# Patient Record
Sex: Male | Born: 1975 | Race: White | Hispanic: No | Marital: Married | Smoking: Current some day smoker
Health system: Southern US, Community
[De-identification: ages and names within clinical notes are randomized; demographics above are authoritative.]

## PROBLEM LIST (undated history)

## (undated) DIAGNOSIS — F419 Anxiety disorder, unspecified: Secondary | ICD-10-CM

## (undated) HISTORY — PX: KNEE SURGERY: SHX244

---

## 2007-12-21 ENCOUNTER — Ambulatory Visit (HOSPITAL_BASED_OUTPATIENT_CLINIC_OR_DEPARTMENT_OTHER): Admission: RE | Admit: 2007-12-21 | Discharge: 2007-12-22 | Payer: Self-pay | Admitting: Orthopedic Surgery

## 2010-12-29 NOTE — Op Note (Signed)
NAME:  Nathaniel Rivas, Nathaniel Rivas NO.:  [id number]   MEDICAL RECORD NO.:  [id number]          PATIENT TYPE:  AMB   LOCATION:  DSC                          FACILITY:  [facility]   PHYSICIAN:  Loreta Ave, M.D. DATE OF BIRTH:  1975/12/19   DATE OF PROCEDURE:  12/21/2007  DATE OF DISCHARGE:                               OPERATIVE REPORT   PREOPERATIVE DIAGNOSES:  Left knee anterior cruciate ligament tear with  anterolateral rotary instability.  Healed medial collateral ligament  sprain.  Medial lateral meniscus tears.   POSTOPERATIVE DIAGNOSIS:  Left knee anterior cruciate ligament tear with  anterolateral rotary instability.  Healed medial collateral ligament  sprain.  Medial lateral meniscus tears with a reparable medial meniscus  tear, irreparable lateral meniscus tear, and a healed medial collateral  ligament sprain.   PROCEDURE:  Left knee exam under anesthesia, arthroscopy with partial  lateral meniscectomy.  Medial meniscus repair with a suture bridge  Arthrex tied.  Arthroscopic endoscopic anterior cruciate ligament  reconstruction, patellar tendon autograft bone tendon bone with  notchplasty, bioabsorbable screw fixation.   SURGEON:  Loreta Ave, MD   ASSISTANT:  Zonia Kief, PA present throughout the entire case necessary  for timely completion of procedure.   ANESTHESIA:  General.   BLOOD LOSS:  Minimal.   TOURNIQUET TIME:  1 hour 20 minutes.   SPECIMENS:  None.   CULTURES:  None.   COMPLICATIONS:  None.   PROCEDURE:  Soft compressor with knee immobilizer.   PROCEDURE:  The patient brought to the operating room, placed on the  operating table in supine position.  After adequate anesthesia had been  obtained, both knees examined.  Of note, the offset right uninjured knee  was examined and it was found to have posterior cruciate ligament  instability with a positive ski slope and positive posterior drawer.  ACL collaterals all intact, full motion.   The newly anterior left knee  examined.  Positive Lachman, positive drawer, positive pivot shift.  MCL  stable in full extension and flexion.  Full motion.  Tourniquet applied  on the left.  Prepped and draped in usual sterile fashion.  Exsanguinated with elevation, Esmarch tourniquet inflated to 350 mmHg.  Three portals created, one superolateral, one each medial, and lateral  parapatellar.  Inflow catheter induced, the standard arthroscope was  induced and inspected.  Articular cartilage intact except for some grade  1-2 fissuring medial femoral condyle for a missed impaction injury.  Superficial debridement.  No areas of full-thickness loss.  ACL  complete, mid substance tear debrided.  PCL intact.  Moderate narrowing  of the notch open with notchplasty with a shaver and bur.  Medial  meniscus undersurface peripheral tear junction middle posterior third  going about three-quarters way through on the bottom.  Scar five.  Very  peripheral.  It was repaired with the Arthrex suture bridge placing a  horizontal mattress suture across the tear anchored it on the backside  with the peak anchors.  Nice firm repair confirmed.  Lateral meniscus  had complex irreparable tearing entire posterior half.  About half the  posterior half removed,  a large flap taken off the back, and I was able  to leave about half the posterior half intact tapering the remaining  meniscus.  Instruments and fluid were then removed.  Anterior incision  patella to tibial tubercle.  Middle third patellar tendon harvested,  bone tendon, bone for 10-mm tunnels and prepared for 10-mm tunnels  placing FiberWire at either end.  Defect in the tendon closed with 0  Vicryl.  Arthroscope reintroduced.  Notchplasty confirmed.  Small  incision medial to the tibial tubercle.  K-wire driven from there out  through the footprint of the ACL on the tibia.  Good placement  confirmed.  Overdrilled with a 10-mm reamer.  Debris cleared with  a  shaver.  Femoral guide inserted across tibial tunnel notch on back  cortex femur.  K-wire driven and then overdrilled with a 2-mm reamer for  appropriate depth of the graft and pegs.  Both tunnels assessed found to  be in good position.  Debris cleared throughout the knee.  Tubing passer  inserted across both tunnels and out through a stab wound in the  anterolateral thigh.  Nitinol wire brought to the medial portal out  through the femoral tunnel.  Graft attached, tubing passer pulled in  across the knee seating the pegs well in the tibia and femoral tunnels.  Fixed in the femoral tunnel with a 9 x 25 bioabsorbable screw placed  over nitinol wire.  Excellent capturing and fixation confirmed.  Knee  placed at 7 degrees of flexion, posterior drawer applied, grafted  tightly, and then fixed in the tibial tunnel over nitinol wire with the  10 x 25 bioabsorbable screw.  At completion, excellent capture and  fixation of the graft above and below.  Negative Lachman and drawer.  Full motion.  No impingement on the graft when viewed through full  motion arthroscopically.  Entire knee exam arthroscopically no other  findings appreciated.  Instruments and fluid all removed.  Wounds were  all irrigated and closed with subcutaneous subcuticular Vicryl.  Portals  all closed with nylon.  Sterile compressive dressing applied.  Tourniquet fully removed.  Knee immobilizer applied.  Anesthesia  reversed.  Brought to recovery room.  Tolerated surgery well.  No  complications.      Loreta Ave, M.D.  Electronically Signed     DFM/MEDQ  D:  12/21/2007  T:  12/22/2007  Job:  [id number]

## 2010-12-29 NOTE — Op Note (Signed)
NAME:  Nathaniel Rivas, Nathaniel Rivas           ACCOUNT NO.:  [id number]   MEDICAL RECORD NO.:  [id number]          PATIENT TYPE:  AMB   LOCATION:  DSC                          FACILITY:  [facility]   PHYSICIAN:  Daniel F. Murphy, M.D. DATE OF BIRTH:  08/16/1975   DATE OF PROCEDURE:  12/21/2007  DATE OF DISCHARGE:                               OPERATIVE REPORT   PREOPERATIVE DIAGNOSES:  Left knee anterior cruciate ligament tear with  anterolateral rotary instability.  Healed medial collateral ligament  sprain.  Medial lateral meniscus tears.   POSTOPERATIVE DIAGNOSIS:  Left knee anterior cruciate ligament tear with  anterolateral rotary instability.  Healed medial collateral ligament  sprain.  Medial lateral meniscus tears with a reparable medial meniscus  tear, irreparable lateral meniscus tear, and a healed medial collateral  ligament sprain.   PROCEDURE:  Left knee exam under anesthesia, arthroscopy with partial  lateral meniscectomy.  Medial meniscus repair with a suture bridge  Arthrex tied.  Arthroscopic endoscopic anterior cruciate ligament  reconstruction, patellar tendon autograft bone tendon bone with  notchplasty, bioabsorbable screw fixation.   SURGEON:  Daniel F. Murphy, MD   ASSISTANT:  James Owens, PA present throughout the entire case necessary  for timely completion of procedure.   ANESTHESIA:  General.   BLOOD LOSS:  Minimal.   TOURNIQUET TIME:  1 hour 20 minutes.   SPECIMENS:  None.   CULTURES:  None.   COMPLICATIONS:  None.   PROCEDURE:  Soft compressor with knee immobilizer.   PROCEDURE:  The patient brought to the operating room, placed on the  operating table in supine position.  After adequate anesthesia had been  obtained, both knees examined.  Of note, the offset right uninjured knee  was examined and it was found to have posterior cruciate ligament  instability with a positive ski slope and positive posterior drawer.  ACL collaterals all intact, full motion.   The newly anterior left knee  examined.  Positive Lachman, positive drawer, positive pivot shift.  MCL  stable in full extension and flexion.  Full motion.  Tourniquet applied  on the left.  Prepped and draped in usual sterile fashion.  Exsanguinated with elevation, Esmarch tourniquet inflated to 350 mmHg.  Three portals created, one superolateral, one each medial, and lateral  parapatellar.  Inflow catheter induced, the standard arthroscope was  induced and inspected.  Articular cartilage intact except for some grade  1-2 fissuring medial femoral condyle for a missed impaction injury.  Superficial debridement.  No areas of full-thickness loss.  ACL  complete, mid substance tear debrided.  PCL intact.  Moderate narrowing  of the notch open with notchplasty with a shaver and bur.  Medial  meniscus undersurface peripheral tear junction middle posterior third  going about three-quarters way through on the bottom.  Scar five.  Very  peripheral.  It was repaired with the Arthrex suture bridge placing a  horizontal mattress suture across the tear anchored it on the backside  with the peak anchors.  Nice firm repair confirmed.  Lateral meniscus  had complex irreparable tearing entire posterior half.  About half the  posterior half removed,   a large flap taken off the back, and I was able  to leave about half the posterior half intact tapering the remaining  meniscus.  Instruments and fluid were then removed.  Anterior incision  patella to tibial tubercle.  Middle third patellar tendon harvested,  bone tendon, bone for 10-mm tunnels and prepared for 10-mm tunnels  placing FiberWire at either end.  Defect in the tendon closed with 0  Vicryl.  Arthroscope reintroduced.  Notchplasty confirmed.  Small  incision medial to the tibial tubercle.  K-wire driven from there out  through the footprint of the ACL on the tibia.  Good placement  confirmed.  Overdrilled with a 10-mm reamer.  Debris cleared with  a  shaver.  Femoral guide inserted across tibial tunnel notch on back  cortex femur.  K-wire driven and then overdrilled with a 2-mm reamer for  appropriate depth of the graft and pegs.  Both tunnels assessed found to  be in good position.  Debris cleared throughout the knee.  Tubing passer  inserted across both tunnels and out through a stab wound in the  anterolateral thigh.  Nitinol wire brought to the medial portal out  through the femoral tunnel.  Graft attached, tubing passer pulled in  across the knee seating the pegs well in the tibia and femoral tunnels.  Fixed in the femoral tunnel with a 9 x 25 bioabsorbable screw placed  over nitinol wire.  Excellent capturing and fixation confirmed.  Knee  placed at 7 degrees of flexion, posterior drawer applied, grafted  tightly, and then fixed in the tibial tunnel over nitinol wire with the  10 x 25 bioabsorbable screw.  At completion, excellent capture and  fixation of the graft above and below.  Negative Lachman and drawer.  Full motion.  No impingement on the graft when viewed through full  motion arthroscopically.  Entire knee exam arthroscopically no other  findings appreciated.  Instruments and fluid all removed.  Wounds were  all irrigated and closed with subcutaneous subcuticular Vicryl.  Portals  all closed with nylon.  Sterile compressive dressing applied.  Tourniquet fully removed.  Knee immobilizer applied.  Anesthesia  reversed.  Brought to recovery room.  Tolerated surgery well.  No  complications.      Daniel F. Murphy, M.D.  Electronically Signed     DFM/MEDQ  D:  12/21/2007  T:  12/22/2007  Job:  [id number] 

## 2011-02-16 ENCOUNTER — Emergency Department (HOSPITAL_BASED_OUTPATIENT_CLINIC_OR_DEPARTMENT_OTHER)
Admission: EM | Admit: 2011-02-16 | Discharge: 2011-02-16 | Disposition: A | Discharge status: Home / Self Care | Payer: BC Managed Care – PPO | Attending: Emergency Medicine | Admitting: Emergency Medicine

## 2011-02-16 DIAGNOSIS — R040 Epistaxis: Secondary | ICD-10-CM | POA: Insufficient documentation

## 2014-04-25 DIAGNOSIS — F419 Anxiety disorder, unspecified: Secondary | ICD-10-CM | POA: Insufficient documentation

## 2014-09-27 DIAGNOSIS — E7800 Pure hypercholesterolemia, unspecified: Secondary | ICD-10-CM | POA: Insufficient documentation

## 2015-10-06 DIAGNOSIS — Z720 Tobacco use: Secondary | ICD-10-CM | POA: Insufficient documentation

## 2016-04-20 ENCOUNTER — Ambulatory Visit (INDEPENDENT_AMBULATORY_CARE_PROVIDER_SITE_OTHER): Payer: BLUE CROSS/BLUE SHIELD | Admitting: Family Medicine

## 2016-04-20 ENCOUNTER — Ambulatory Visit (HOSPITAL_BASED_OUTPATIENT_CLINIC_OR_DEPARTMENT_OTHER)
Admission: RE | Admit: 2016-04-20 | Discharge: 2016-04-20 | Disposition: A | Discharge status: Home / Self Care | Payer: BLUE CROSS/BLUE SHIELD | Source: Ambulatory Visit | Attending: Family Medicine | Admitting: Family Medicine

## 2016-04-20 ENCOUNTER — Encounter: Payer: Self-pay | Admitting: Family Medicine

## 2016-04-20 VITALS — BP 113/81 | HR 76 | Ht 69.0 in | Wt 190.0 lb

## 2016-04-20 DIAGNOSIS — R937 Abnormal findings on diagnostic imaging of other parts of musculoskeletal system: Secondary | ICD-10-CM | POA: Diagnosis not present

## 2016-04-20 DIAGNOSIS — M25561 Pain in right knee: Secondary | ICD-10-CM | POA: Insufficient documentation

## 2016-04-20 MED ORDER — MELOXICAM 15 MG PO TABS: 15.0000 mg | ORAL_TABLET | Freq: Every day | ORAL | 2 refills | Status: DC

## 2016-04-20 NOTE — Patient Instructions (Signed)
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 Ice the area 15 minutes at a time 3-4 times a day. Meloxicam 15mg  daily with food for pain, inflammation, swelling - take at least for 7-10 days regularly then as needed. Compression sleeve or ACE wrap during the day to help keep swelling down. Knee curls, hacky sack, inside leg raise exercises 3 sets of 10 once a day. We can consider a cortisone injection for the bursitis if you're not improving. You can consider wearing a kneepad too to prevent irritation of this. Typically these take 4-6 weeks total to resolve though with your line of work it may take longer.

## 2016-04-22 DIAGNOSIS — M25561 Pain in right knee: Secondary | ICD-10-CM | POA: Insufficient documentation

## 2016-04-22 NOTE — Assessment & Plan Note (Signed)
Independently reviewed radiographs and no evidence fracture.  Exam consistent with traumatic pes bursitis and ultrasound confirms this.  He will start with icing, meloxicam, compression.  Shown home exercises to do daily.  Consider injection if not improving.  Reports he already is using a kneepad.  F/u in 4-6 weeks.

## 2016-04-22 NOTE — Progress Notes (Signed)
PCP: Angelica ChessmanAGUIAR,RAFAELA M., MD  Subjective:   HPI: Patient is a 40 y.o. male here for right knee pain.  Patient reports 3 weeks ago he accidentally struck medial right knee on a metal bedframe. Has continued to have pain in this area anteromedial right knee since then. Associated swelling. Pain is 3/10, dull. Has not tried anything for this. No prior issues with this knee. No skin changes, numbness.  No past medical history on file.  No current outpatient prescriptions on file prior to visit.   No current facility-administered medications on file prior to visit.     No past surgical history on file.  No Known Allergies  Social History   Social History  . Marital status: Married    Spouse name: N/A  . Number of children: N/A  . Years of education: N/A   Occupational History  . Not on file.   Social History Main Topics  . Smoking status: Current Some Day Smoker  . Smokeless tobacco: Never Used  . Alcohol use Not on file  . Drug use: Unknown  . Sexual activity: Not on file   Other Topics Concern  . Not on file   Social History Narrative  . No narrative on file    No family history on file.  BP 113/81   Pulse 76   Ht 5\' 9"  (1.753 m)   Wt 190 lb (86.2 kg)   BMI 28.06 kg/m   Review of Systems: See HPI above.    Objective:  Physical Exam:  Gen: NAD, comfortable in exam room  Right knee: Prominent tibial tubercle, mild swelling pes area.  No effusion, bruising, other deformity. TTP pes bursa only.  No joint line, other tenderness. FROM. Negative ant/post drawers. Negative valgus/varus testing. Negative lachmanns. Negative mcmurrays, apleys, patellar apprehension. NV intact distally.  Left knee: FROM without pain.  MSK u/s right knee: pes bursitis noted.  Soft tissue swelling over tibial tubercle but no bony abnormalities.    Assessment & Plan:  1. Right knee pain - Independently reviewed radiographs and no evidence fracture.  Exam consistent with  traumatic pes bursitis and ultrasound confirms this.  He will start with icing, meloxicam, compression.  Shown home exercises to do daily.  Consider injection if not improving.  Reports he already is using a kneepad.  F/u in 4-6 weeks.

## 2017-03-28 ENCOUNTER — Emergency Department (HOSPITAL_BASED_OUTPATIENT_CLINIC_OR_DEPARTMENT_OTHER): Payer: Self-pay

## 2017-03-28 ENCOUNTER — Encounter (HOSPITAL_COMMUNITY)
Admission: EM | Disposition: A | Discharge status: Home / Self Care | Payer: Self-pay | Source: Home / Self Care | Attending: General Surgery

## 2017-03-28 ENCOUNTER — Inpatient Hospital Stay (HOSPITAL_BASED_OUTPATIENT_CLINIC_OR_DEPARTMENT_OTHER)
Admission: EM | Admit: 2017-03-28 | Discharge: 2017-03-29 | DRG: 340 | Disposition: A | Discharge status: Home / Self Care | Payer: Self-pay | Attending: General Surgery | Admitting: General Surgery

## 2017-03-28 ENCOUNTER — Inpatient Hospital Stay (HOSPITAL_COMMUNITY): Payer: Self-pay | Admitting: Anesthesiology

## 2017-03-28 ENCOUNTER — Encounter (HOSPITAL_BASED_OUTPATIENT_CLINIC_OR_DEPARTMENT_OTHER): Payer: Self-pay | Admitting: Emergency Medicine

## 2017-03-28 DIAGNOSIS — K353 Acute appendicitis with localized peritonitis: Principal | ICD-10-CM | POA: Diagnosis present

## 2017-03-28 DIAGNOSIS — F419 Anxiety disorder, unspecified: Secondary | ICD-10-CM | POA: Diagnosis present

## 2017-03-28 DIAGNOSIS — F17200 Nicotine dependence, unspecified, uncomplicated: Secondary | ICD-10-CM | POA: Diagnosis present

## 2017-03-28 DIAGNOSIS — K3589 Other acute appendicitis without perforation or gangrene: Secondary | ICD-10-CM

## 2017-03-28 DIAGNOSIS — K37 Unspecified appendicitis: Secondary | ICD-10-CM | POA: Diagnosis present

## 2017-03-28 DIAGNOSIS — K3533 Acute appendicitis with perforation and localized peritonitis, with abscess: Secondary | ICD-10-CM | POA: Diagnosis present

## 2017-03-28 HISTORY — DX: Anxiety disorder, unspecified: F41.9

## 2017-03-28 HISTORY — PX: LAPAROSCOPIC APPENDECTOMY: SHX408

## 2017-03-28 LAB — CBC WITH DIFFERENTIAL/PLATELET
Basophils Absolute: 0 10*3/uL (ref 0.0–0.1)
Basophils Relative: 0 %
EOS ABS: 0.2 10*3/uL (ref 0.0–0.7)
Eosinophils Relative: 2 %
HCT: 38.6 % — ABNORMAL LOW (ref 39.0–52.0)
HEMOGLOBIN: 13.4 g/dL (ref 13.0–17.0)
LYMPHS PCT: 13 %
Lymphs Abs: 1.2 10*3/uL (ref 0.7–4.0)
MCH: 32.1 pg (ref 26.0–34.0)
MCHC: 34.7 g/dL (ref 30.0–36.0)
MCV: 92.6 fL (ref 78.0–100.0)
Monocytes Absolute: 0.8 10*3/uL (ref 0.1–1.0)
Monocytes Relative: 9 %
NEUTROS ABS: 6.7 10*3/uL (ref 1.7–7.7)
NEUTROS PCT: 75 %
Platelets: 247 10*3/uL (ref 150–400)
RBC: 4.17 MIL/uL — AB (ref 4.22–5.81)
RDW: 12.5 % (ref 11.5–15.5)
WBC: 8.9 10*3/uL (ref 4.0–10.5)

## 2017-03-28 LAB — COMPREHENSIVE METABOLIC PANEL
ALBUMIN: 3.9 g/dL (ref 3.5–5.0)
ALT: 22 U/L (ref 17–63)
ANION GAP: 8 (ref 5–15)
AST: 21 U/L (ref 15–41)
Alkaline Phosphatase: 61 U/L (ref 38–126)
BILIRUBIN TOTAL: 0.9 mg/dL (ref 0.3–1.2)
BUN: 12 mg/dL (ref 6–20)
CALCIUM: 9 mg/dL (ref 8.9–10.3)
CO2: 27 mmol/L (ref 22–32)
Chloride: 102 mmol/L (ref 101–111)
Creatinine, Ser: 0.98 mg/dL (ref 0.61–1.24)
Glucose, Bld: 100 mg/dL — ABNORMAL HIGH (ref 65–99)
POTASSIUM: 4 mmol/L (ref 3.5–5.1)
Sodium: 137 mmol/L (ref 135–145)
TOTAL PROTEIN: 7.2 g/dL (ref 6.5–8.1)

## 2017-03-28 LAB — URINALYSIS, ROUTINE W REFLEX MICROSCOPIC
GLUCOSE, UA: NEGATIVE mg/dL
Hgb urine dipstick: NEGATIVE
Ketones, ur: 15 mg/dL — AB
LEUKOCYTES UA: NEGATIVE
Nitrite: NEGATIVE
PROTEIN: NEGATIVE mg/dL
Specific Gravity, Urine: 1.037 — ABNORMAL HIGH (ref 1.005–1.030)
pH: 6.5 (ref 5.0–8.0)

## 2017-03-28 LAB — MRSA PCR SCREENING: MRSA BY PCR: NEGATIVE

## 2017-03-28 LAB — LIPASE, BLOOD: LIPASE: 24 U/L (ref 11–51)

## 2017-03-28 SURGERY — APPENDECTOMY, LAPAROSCOPIC
Anesthesia: General

## 2017-03-28 MED ORDER — HYDROCODONE-ACETAMINOPHEN 5-325 MG PO TABS
1.0000 | ORAL_TABLET | ORAL | Status: DC | PRN
  Administered 2017-03-29: 2 via ORAL
  Filled 2017-03-28: qty 2

## 2017-03-28 MED ORDER — BUPIVACAINE-EPINEPHRINE (PF) 0.25% -1:200000 IJ SOLN
INTRAMUSCULAR | Status: AC
  Filled 2017-03-28: qty 30

## 2017-03-28 MED ORDER — SUGAMMADEX SODIUM 200 MG/2ML IV SOLN
INTRAVENOUS | Status: DC | PRN
  Administered 2017-03-28: 200 mg via INTRAVENOUS

## 2017-03-28 MED ORDER — ONDANSETRON HCL 4 MG/2ML IJ SOLN: 4.0000 mg | Freq: Once | INTRAMUSCULAR | Status: DC | PRN

## 2017-03-28 MED ORDER — METRONIDAZOLE IN NACL 5-0.79 MG/ML-% IV SOLN
500.0000 mg | Freq: Three times a day (TID) | INTRAVENOUS | Status: DC
  Administered 2017-03-28 – 2017-03-29 (×2): 500 mg via INTRAVENOUS
  Filled 2017-03-28 (×4): qty 100

## 2017-03-28 MED ORDER — SUCCINYLCHOLINE CHLORIDE 200 MG/10ML IV SOSY
PREFILLED_SYRINGE | INTRAVENOUS | Status: AC
  Filled 2017-03-28: qty 10

## 2017-03-28 MED ORDER — DEXTROSE 5 % IV SOLN
2.0000 g | INTRAVENOUS | Status: DC
  Filled 2017-03-28: qty 2

## 2017-03-28 MED ORDER — BUPIVACAINE-EPINEPHRINE 0.25% -1:200000 IJ SOLN
INTRAMUSCULAR | Status: DC | PRN
  Administered 2017-03-28: 30 mL

## 2017-03-28 MED ORDER — DEXAMETHASONE SODIUM PHOSPHATE 10 MG/ML IJ SOLN
INTRAMUSCULAR | Status: DC | PRN
  Administered 2017-03-28: 10 mg via INTRAVENOUS

## 2017-03-28 MED ORDER — IOPAMIDOL (ISOVUE-300) INJECTION 61%
100.0000 mL | Freq: Once | INTRAVENOUS | Status: AC | PRN
  Administered 2017-03-28: 100 mL via INTRAVENOUS

## 2017-03-28 MED ORDER — KETOROLAC TROMETHAMINE 30 MG/ML IJ SOLN
30.0000 mg | Freq: Once | INTRAMUSCULAR | Status: AC
  Administered 2017-03-28: 30 mg via INTRAVENOUS
  Filled 2017-03-28: qty 1

## 2017-03-28 MED ORDER — PROPOFOL 10 MG/ML IV BOLUS
INTRAVENOUS | Status: AC
  Filled 2017-03-28: qty 20

## 2017-03-28 MED ORDER — ONDANSETRON HCL 4 MG/2ML IJ SOLN: 4.0000 mg | Freq: Four times a day (QID) | INTRAMUSCULAR | Status: DC | PRN

## 2017-03-28 MED ORDER — SODIUM CHLORIDE 0.9 % IV SOLN: INTRAVENOUS | Status: DC

## 2017-03-28 MED ORDER — LACTATED RINGERS IV SOLN: INTRAVENOUS | Status: DC

## 2017-03-28 MED ORDER — ROCURONIUM BROMIDE 50 MG/5ML IV SOSY
PREFILLED_SYRINGE | INTRAVENOUS | Status: AC
  Filled 2017-03-28: qty 5

## 2017-03-28 MED ORDER — HYDROMORPHONE HCL-NACL 0.5-0.9 MG/ML-% IV SOSY: 0.2500 mg | PREFILLED_SYRINGE | INTRAVENOUS | Status: DC | PRN

## 2017-03-28 MED ORDER — SUGAMMADEX SODIUM 200 MG/2ML IV SOLN
INTRAVENOUS | Status: AC
  Filled 2017-03-28: qty 2

## 2017-03-28 MED ORDER — MIDAZOLAM HCL 2 MG/2ML IJ SOLN
INTRAMUSCULAR | Status: AC
  Filled 2017-03-28: qty 2

## 2017-03-28 MED ORDER — 0.9 % SODIUM CHLORIDE (POUR BTL) OPTIME
TOPICAL | Status: DC | PRN
  Administered 2017-03-28: 1000 mL

## 2017-03-28 MED ORDER — METRONIDAZOLE IN NACL 5-0.79 MG/ML-% IV SOLN
500.0000 mg | Freq: Once | INTRAVENOUS | Status: AC
  Administered 2017-03-28: 500 mg via INTRAVENOUS
  Filled 2017-03-28: qty 100

## 2017-03-28 MED ORDER — ENOXAPARIN SODIUM 40 MG/0.4ML ~~LOC~~ SOLN
40.0000 mg | SUBCUTANEOUS | Status: DC
  Administered 2017-03-29: 40 mg via SUBCUTANEOUS
  Filled 2017-03-28: qty 0.4

## 2017-03-28 MED ORDER — ROCURONIUM BROMIDE 10 MG/ML (PF) SYRINGE
PREFILLED_SYRINGE | INTRAVENOUS | Status: DC | PRN
  Administered 2017-03-28: 40 mg via INTRAVENOUS

## 2017-03-28 MED ORDER — MEPERIDINE HCL 50 MG/ML IJ SOLN: 6.2500 mg | INTRAMUSCULAR | Status: DC | PRN

## 2017-03-28 MED ORDER — SUCCINYLCHOLINE CHLORIDE 200 MG/10ML IV SOSY
PREFILLED_SYRINGE | INTRAVENOUS | Status: DC | PRN
  Administered 2017-03-28: 180 mg via INTRAVENOUS

## 2017-03-28 MED ORDER — FENTANYL CITRATE (PF) 100 MCG/2ML IJ SOLN
INTRAMUSCULAR | Status: DC | PRN
  Administered 2017-03-28 (×2): 50 ug via INTRAVENOUS
  Administered 2017-03-28: 150 ug via INTRAVENOUS

## 2017-03-28 MED ORDER — LACTATED RINGERS IV SOLN
INTRAVENOUS | Status: DC
  Administered 2017-03-28: 19:00:00 via INTRAVENOUS

## 2017-03-28 MED ORDER — ONDANSETRON 4 MG PO TBDP: 4.0000 mg | ORAL_TABLET | Freq: Four times a day (QID) | ORAL | Status: DC | PRN

## 2017-03-28 MED ORDER — MORPHINE SULFATE (PF) 2 MG/ML IV SOLN
2.0000 mg | INTRAVENOUS | Status: DC | PRN
  Administered 2017-03-28 – 2017-03-29 (×3): 2 mg via INTRAVENOUS
  Filled 2017-03-28 (×3): qty 1

## 2017-03-28 MED ORDER — ONDANSETRON HCL 4 MG/2ML IJ SOLN
INTRAMUSCULAR | Status: DC | PRN
  Administered 2017-03-28: 4 mg via INTRAVENOUS

## 2017-03-28 MED ORDER — LACTATED RINGERS IR SOLN
Status: DC | PRN
  Administered 2017-03-28: 1000 mL

## 2017-03-28 MED ORDER — LIDOCAINE 2% (20 MG/ML) 5 ML SYRINGE
INTRAMUSCULAR | Status: DC | PRN
  Administered 2017-03-28: 100 mg via INTRAVENOUS

## 2017-03-28 MED ORDER — ALPRAZOLAM 0.25 MG PO TABS
0.2500 mg | ORAL_TABLET | Freq: Two times a day (BID) | ORAL | Status: DC | PRN
  Administered 2017-03-28: 0.25 mg via ORAL
  Filled 2017-03-28: qty 1

## 2017-03-28 MED ORDER — LIDOCAINE 2% (20 MG/ML) 5 ML SYRINGE
INTRAMUSCULAR | Status: AC
  Filled 2017-03-28: qty 5

## 2017-03-28 MED ORDER — SODIUM CHLORIDE 0.9 % IV SOLN
INTRAVENOUS | Status: DC | PRN
  Administered 2017-03-28 (×2): via INTRAVENOUS

## 2017-03-28 MED ORDER — PROPOFOL 10 MG/ML IV BOLUS
INTRAVENOUS | Status: DC | PRN
  Administered 2017-03-28: 150 mg via INTRAVENOUS

## 2017-03-28 MED ORDER — FENTANYL CITRATE (PF) 250 MCG/5ML IJ SOLN
INTRAMUSCULAR | Status: AC
  Filled 2017-03-28: qty 5

## 2017-03-28 MED ORDER — DEXTROSE 5 % IV SOLN
1.0000 g | Freq: Once | INTRAVENOUS | Status: AC
  Administered 2017-03-28: 1 g via INTRAVENOUS
  Filled 2017-03-28: qty 10

## 2017-03-28 MED ORDER — SODIUM CHLORIDE 0.9 % IV BOLUS (SEPSIS)
1000.0000 mL | Freq: Once | INTRAVENOUS | Status: AC
  Administered 2017-03-28: 1000 mL via INTRAVENOUS

## 2017-03-28 SURGICAL SUPPLY — 33 items
APPLIER CLIP 5 13 M/L LIGAMAX5 (MISCELLANEOUS)
APPLIER CLIP ROT 10 11.4 M/L (STAPLE)
CABLE HIGH FREQUENCY MONO STRZ (ELECTRODE) IMPLANT
CHLORAPREP W/TINT 26ML (MISCELLANEOUS) ×3 IMPLANT
CLIP APPLIE 5 13 M/L LIGAMAX5 (MISCELLANEOUS) IMPLANT
CLIP APPLIE ROT 10 11.4 M/L (STAPLE) IMPLANT
COVER SURGICAL LIGHT HANDLE (MISCELLANEOUS) ×3 IMPLANT
CUTTER FLEX LINEAR 45M (STAPLE) ×3 IMPLANT
DECANTER SPIKE VIAL GLASS SM (MISCELLANEOUS) ×3 IMPLANT
DERMABOND ADVANCED (GAUZE/BANDAGES/DRESSINGS) ×2
DERMABOND ADVANCED .7 DNX12 (GAUZE/BANDAGES/DRESSINGS) ×1 IMPLANT
DRAPE LAPAROSCOPIC ABDOMINAL (DRAPES) ×3 IMPLANT
ELECT REM PT RETURN 15FT ADLT (MISCELLANEOUS) ×3 IMPLANT
GLOVE BIOGEL PI IND STRL 7.5 (GLOVE) ×1 IMPLANT
GLOVE BIOGEL PI INDICATOR 7.5 (GLOVE) ×2
GLOVE ECLIPSE 7.5 STRL STRAW (GLOVE) ×3 IMPLANT
GOWN STRL REUS W/TWL XL LVL3 (GOWN DISPOSABLE) ×6 IMPLANT
KIT BASIN OR (CUSTOM PROCEDURE TRAY) ×3 IMPLANT
PAD POSITIONING PINK XL (MISCELLANEOUS) ×3 IMPLANT
POUCH SPECIMEN RETRIEVAL 10MM (ENDOMECHANICALS) ×3 IMPLANT
RELOAD 45 VASCULAR/THIN (ENDOMECHANICALS) IMPLANT
RELOAD STAPLE TA45 3.5 REG BLU (ENDOMECHANICALS) ×3 IMPLANT
SCISSORS LAP 5X35 DISP (ENDOMECHANICALS) ×3 IMPLANT
SHEARS HARMONIC ACE PLUS 36CM (ENDOMECHANICALS) ×3 IMPLANT
SLEEVE XCEL OPT CAN 5 100 (ENDOMECHANICALS) ×3 IMPLANT
SUT MNCRL AB 4-0 PS2 18 (SUTURE) ×3 IMPLANT
SUT VICRYL 0 UR6 27IN ABS (SUTURE) ×3 IMPLANT
TOWEL OR 17X26 10 PK STRL BLUE (TOWEL DISPOSABLE) ×3 IMPLANT
TRAY FOLEY W/METER SILVER 16FR (SET/KITS/TRAYS/PACK) ×3 IMPLANT
TRAY LAPAROSCOPIC (CUSTOM PROCEDURE TRAY) ×3 IMPLANT
TROCAR BLADELESS OPT 5 100 (ENDOMECHANICALS) ×3 IMPLANT
TROCAR XCEL BLUNT TIP 100MML (ENDOMECHANICALS) ×3 IMPLANT
TUBING INSUF HEATED (TUBING) ×3 IMPLANT

## 2017-03-28 NOTE — ED Notes (Signed)
ED Provider at bedside. 

## 2017-03-28 NOTE — ED Notes (Signed)
RN present as witness for GI/GU exam by PA at bedside.

## 2017-03-28 NOTE — ED Notes (Signed)
Patient transported to CT 

## 2017-03-28 NOTE — ED Provider Notes (Signed)
[facility] Note   CSN: [id number] Arrival date & time: 03/28/17  0849     History   Chief Complaint Chief Complaint  Patient presents with  . Abdominal Pain    HPI Nathaniel Rivas is a 41 y.o. male who presents with 2 days of right sided abdominal pain. Patient reports that pain has been constant and describes it as a "stabbing" pain. He currently rates his abdominal pain at a 8/10. He reports that he is taken [facility] Goody's with no improvement of symptoms. He denies any other alleviating or aggravating factors. Patient reports some decreased appetite but states that he has been able to tolerate PO. Patient reports that his last bowel movement was yesterday and was normal. No blood or melena. Patient denies any fever, nausea/vomiting, chest pain, difficult breathing, dysuria, hematuria, testicular pain/swelling penile pain..  The history is provided by the patient.    Past Medical History:  Diagnosis Date  . Anxiety     Patient Active Problem List   Diagnosis Date Noted  . Appendicitis 03/28/2017  . Right knee pain 04/22/2016  . Tobacco use 10/06/2015  . Elevated cholesterol 09/27/2014  . Anxiety 04/25/2014    Past Surgical History:  Procedure Laterality Date  . KNEE SURGERY         Home Medications    Prior to Admission medications   Medication Sig Start Date End Date Taking? Authorizing Provider  ALPRAZolam Prudy Feeler) 0.25 MG tablet  04/06/16   [provider]    Family History No family history on file.  Social History Social History  Substance Use Topics  . Smoking status: Current Some Day Smoker  . Smokeless tobacco: Never Used  . Alcohol use Yes     Allergies   Patient has no known allergies.   Review of Systems Review of Systems  Constitutional: Positive for appetite change. Negative for chills and fever.  Respiratory: Negative for shortness of breath.   Cardiovascular: Negative for chest pain.  Gastrointestinal:  Positive for abdominal pain. Negative for diarrhea, nausea and vomiting.  Genitourinary: Negative for dysuria and hematuria.     Physical Exam Updated Vital Signs BP 121/85 (BP Location: Left Arm)   Pulse 65   Temp 97.9 F (36.6 C) (Oral)   Resp 18   Ht 5\' 9"  (1.753 m)   Wt 88.5 kg (195 lb)   SpO2 98%   BMI 28.80 kg/m   Physical Exam  Constitutional: He is oriented to person, place, and time. He appears well-developed and well-nourished.  Sitting comfortably on examination table  HENT:  Head: Normocephalic and atraumatic.  Mouth/Throat: Oropharynx is clear and moist and mucous membranes are normal.  Eyes: Pupils are equal, round, and reactive to light. Conjunctivae, EOM and lids are normal.  Neck: Full passive range of motion without pain.  Cardiovascular: Normal rate, regular rhythm, normal heart sounds and normal pulses.  Exam reveals no gallop and no friction rub.   No murmur heard. Pulmonary/Chest: Effort normal and breath sounds normal.  Abdominal: Soft. Normal appearance and bowel sounds are normal. There is tenderness. There is tenderness at McBurney's point. There is no rigidity, no guarding, no CVA tenderness and negative Murphy's sign. Hernia confirmed negative in the right inguinal area and confirmed negative in the left inguinal area.    Abdomen is soft, non-distended. Tenderness to palpation to the mid/lower right abdomen. Minimal tenderness at McBurney's point.  Genitourinary: Testes normal and penis normal. Right testis shows no swelling and no tenderness.  Left testis shows no swelling and no tenderness. Uncircumcised.  Genitourinary Comments: The exam was performed with a chaperone present. Normal external male genitalia.   Musculoskeletal: Normal range of motion.  Neurological: He is alert and oriented to person, place, and time.  Skin: Skin is warm and dry. Capillary refill takes less than 2 seconds.  Psychiatric: He has a normal mood and affect. His speech is  normal.  Nursing note and vitals reviewed.    ED Treatments / Results  Labs (all labs ordered are listed, but only abnormal results are displayed) Labs Reviewed  COMPREHENSIVE METABOLIC PANEL - Abnormal; Notable for the following:       Result Value   Glucose, Bld 100 (*)    All other components within normal limits  CBC WITH DIFFERENTIAL/PLATELET - Abnormal; Notable for the following:    RBC 4.17 (*)    HCT 38.6 (*)    All other components within normal limits  URINALYSIS, ROUTINE W REFLEX MICROSCOPIC - Abnormal; Notable for the following:    Color, Urine AMBER (*)    Specific Gravity, Urine 1.037 (*)    Bilirubin Urine SMALL (*)    Ketones, ur 15 (*)    All other components within normal limits  LIPASE, BLOOD  HIV ANTIBODY (ROUTINE TESTING)    EKG  EKG Interpretation None       Radiology Ct Abdomen Pelvis W Contrast  Result Date: 03/28/2017 CLINICAL DATA:  Right lower quadrant pain since Friday. EXAM: CT ABDOMEN AND PELVIS WITH CONTRAST TECHNIQUE: Multidetector CT imaging of the abdomen and pelvis was performed using the standard protocol following bolus administration of intravenous contrast. CONTRAST:  ISOVUE-300 IOPAMIDOL (ISOVUE-300) INJECTION 61% COMPARISON:  None. FINDINGS: Lower chest: Lung bases show no acute findings. Heart size normal. No pericardial or pleural effusion. Hepatobiliary: Liver and gallbladder are unremarkable. No biliary ductal dilatation. Pancreas: Negative. Spleen: Negative. Adrenals/Urinary Tract: Adrenal glands and kidneys are unremarkable. Ureters are decompressed. Bladder is grossly unremarkable. Stomach/Bowel: Stomach and small bowel are unremarkable. Appendix is dilated with fluid and stranding adjacent to the distal portion. No evidence of rupture. No appendicolith. Colon is unremarkable. Vascular/Lymphatic: Atherosclerotic calcification of the arterial vasculature without aneurysm. No pathologically enlarged lymph nodes. Reproductive:  Prostate is normal in size. Other: No free fluid. Mesenteries and peritoneum are otherwise unremarkable. Musculoskeletal: Negative. IMPRESSION: 1. Acute appendicitis without complicating feature. 2.  Aortic atherosclerosis (ICD10-170.0). Electronically Signed   By: Leanna Battles M.D.   On: 03/28/2017 11:59    Procedures Procedures (including critical care time)  Medications Ordered in ED Medications  cefTRIAXone (ROCEPHIN) 1 g in dextrose 5 % 50 mL IVPB (1 g Intravenous New Bag/Given 03/28/17 1243)  metroNIDAZOLE (FLAGYL) IVPB 500 mg (500 mg Intravenous New Bag/Given 03/28/17 1243)  ketorolac (TORADOL) 30 MG/ML injection 30 mg (30 mg Intravenous Given 03/28/17 0941)  sodium chloride 0.9 % bolus 1,000 mL (0 mLs Intravenous Stopped 03/28/17 1124)  iopamidol (ISOVUE-300) 61 % injection 100 mL (100 mLs Intravenous Contrast Given 03/28/17 1142)     Initial Impression / Assessment and Plan / ED Course  I have reviewed the triage vital signs and the nursing notes.  Pertinent labs & imaging results that were available during my care of the patient were reviewed by me and considered in my medical decision making (see chart for details).     41 year old male who presents with 2 days of mid/lower right-sided abdominal pain. No history of fevers, nausea/vomiting/diarrhea. No urinary complaints. Patient is afebrile, non-toxic appearing,  sitting comfortably on examination table. Vital signs reviewed and stable. There is CVA tenderness on physical exam. There is tenderness to the mid/right lower abdomen. For acute infectious etiology versus appendicitis versus kidney stone. We'll plan to order labs including CBC, CMP, UA, lipase. IVF given. Analgesics provided in the department. Plan to hold on imaging until urine is back.  Labs reviewed. Lipase normal. CMP shows hyperglycemia but otherwise unremarkable. CBC unremarkable. UA shows small bilirubin and ketones. Otherwise negative for any acute infection.  Discussed results with patient. He reports some improvement in pain after medication. Repeat abdominal exam showed he is still tender into the right lower quadrant of the abdomen, Specifically at McBurney's point.. Will plan for CT abdomen/pelvis for further evaluation.  CT abdomen/pelvis reviewed. Shows acute appendicitis without perforation or abscess. Discussed results with patient. He reports that he drinks some water at 7 AM this morning. He has not eaten anything today. Will likely need surgical evaluation. Will consult surgery.  Discussed with Dr. Johna SheriffHoxworth (General Surgery). [facility]. When starting Cipro and Flagyl in the emergency department. Updated patient and family on plan.   Final Clinical Impressions(s) / ED Diagnoses   Final diagnoses:  Other acute appendicitis    New Prescriptions New Prescriptions   No medications on file     Rosana HoesLayden, Lindsey A, PA-C 03/28/17 1645    Arby BarrettePfeiffer, Marcy, MD 04/07/17 [phone]

## 2017-03-28 NOTE — Op Note (Signed)
Preoperative Diagnosis: Other acute appendicitis [K35.89] Appendicitis [K37]  Postoprative Diagnosis: appendicitis with perforation and abscess  Procedure: Procedure(s): APPENDECTOMY LAPAROSCOPIC   Surgeon: Glenna FellowsHoxworth, Arzella Rehmann T   Assistants: none  Anesthesia:  General endotracheal anesthesia  Indications: patient is a 41 year old male who presents with 2-3 days of persistent right lower quadrant abdominal pain.CT scan today shows evidence of acute appendicitis without apparent complication. I have recommended proceeding with laparoscopic appendectomy. The procedure and risks were discussed in detail documented elsewhere and he agreed to proceed.    Procedure Detail:  Patient had received preoperative broad-spectrum IV antibiotics. He was taken to the operating room, placed in the supine position on the operating table, and general endotracheal anesthesia induced. Foley catheter was placed.The abdomen was widely sterilely prepped and draped. Patient timeout was performed and correct procedure verified. Access was obtained with a 1/2 cm incision at the umbilicus with an open Hassan technique through a mattress suture of 0 Vicryl and pneumoperitoneum established. Under direct vision 5 mm trochars were placed in the upper midline and left lower quadrant.The appendix was localized adherent to the lateral abdominal wall lateral to the cecum. With careful blunt dissection it was mobilized off the abdominal wall. It was acutely inflamed. I then came across an approximately 1 cm abscess walled off against the abdominal wall with the tip of the appendix with a likely small perforation.  His was completely suctioned and thoroughly irrigated. The appendix was then mobilized and I could elevate the appendix and mobilize the lateral peritoneal attachments with the Harmonic scalpel. The mesial appendix was then sequentially divided with the Harmonic scalpel until the appendix was freed down to its base which was  uninflamed. The appendix was then divided across its base with theGIA 45 mm blue load stapler. The staple line was intact and without bleeding. The appendix was placed in an Endo Catch bag. It was brought out through the umbilical incision which required enlarging the fascial defect slightly and removing the previous suture. Following this the abdomen was carefully inspected and was irrigated with copious saline until clear. There was no bleeding or evidence of injury or other problems. The fascial defect at the umbilicus was closed with several interrupted 0 Vicryl sutures. This was inspected laparoscopically and was intact. All CO2 was evacuated and trochars removed. Skin incisions were closed with subcuticular Monocryl and Dermabond. Sponge needle and instrument counts were correct.    Findings: Acute appendicitis with perforation and abscess  Estimated Blood Loss:  Minimal         Drains: none  Blood Given: none          Specimens: appendix        Complications:  * No complications entered in OR log *         Disposition: PACU - hemodynamically stable.         Condition: stable

## 2017-03-28 NOTE — ED Triage Notes (Signed)
Pt states he has RLQ abdominal pain since Friday.  Pt denies fever, pain is constant.  Pt does lift heavy equipment for a living but nothing new or injury noted.  No N/V/D or constipation.  Last BM yesterday and normal for patient.

## 2017-03-28 NOTE — Anesthesia Postprocedure Evaluation (Signed)
Anesthesia Post Note  Patient: Nathaniel Rivas  Procedure(s) Performed: Procedure(s) (LRB): APPENDECTOMY LAPAROSCOPIC (N/A)     Patient location during evaluation: PACU Anesthesia Type: General Level of consciousness: awake and alert Pain management: pain level controlled Vital Signs Assessment: post-procedure vital signs reviewed and stable Respiratory status: spontaneous breathing, nonlabored ventilation, respiratory function stable and patient connected to nasal cannula oxygen Cardiovascular status: blood pressure returned to baseline and stable Postop Assessment: no signs of nausea or vomiting Anesthetic complications: no    Last Vitals:  Vitals:   03/28/17 1944 03/28/17 2031  BP: 136/88 131/82  Pulse: 70 73  Resp: 14 15  Temp: 36.7 C 36.9 C  SpO2: 100% 100%    Last Pain:  Vitals:   03/28/17 2031  TempSrc: Oral  PainSc:                  Mazzy Santarelli DAVID

## 2017-03-28 NOTE — Anesthesia Procedure Notes (Signed)
Procedure Name: Intubation Date/Time: 03/28/2017 4:46 PM Performed by: Noralyn Pick D Pre-anesthesia Checklist: Patient identified, Emergency Drugs available, Suction available and Patient being monitored Patient Re-evaluated:Patient Re-evaluated prior to induction Oxygen Delivery Method: Circle system utilized Preoxygenation: Pre-oxygenation with 100% oxygen Induction Type: IV induction, Rapid sequence and Cricoid Pressure applied Laryngoscope Size: Mac and 4 Grade View: Grade II Tube type: Oral Number of attempts: 1 Airway Equipment and Method: Stylet and Oral airway Placement Confirmation: ETT inserted through vocal cords under direct vision,  positive ETCO2 and breath sounds checked- equal and bilateral Secured at: 22 cm Tube secured with: Tape Dental Injury: Teeth and Oropharynx as per pre-operative assessment

## 2017-03-28 NOTE — Anesthesia Preprocedure Evaluation (Signed)
Anesthesia Evaluation  Patient identified by MRN, date of birth, ID band Patient awake    Reviewed: Allergy & Precautions, NPO status , Patient's Chart, lab work & pertinent test results  Airway Mallampati: I  TM Distance: >3 FB Neck ROM: Full    Dental   Pulmonary Current Smoker,    Pulmonary exam normal        Cardiovascular Normal cardiovascular exam     Neuro/Psych Anxiety    GI/Hepatic   Endo/Other    Renal/GU      Musculoskeletal   Abdominal   Peds  Hematology   Anesthesia Other Findings   Reproductive/Obstetrics                             Anesthesia Physical Anesthesia Plan  ASA: II and emergent  Anesthesia Plan: General   Post-op Pain Management:    Induction: Intravenous, Rapid sequence and Cricoid pressure planned  PONV Risk Score and Plan: 1 and Ondansetron and Dexamethasone  Airway Management Planned: Oral ETT  Additional Equipment:   Intra-op Plan:   Post-operative Plan: Extubation in OR  Informed Consent: I have reviewed the patients History and Physical, chart, labs and discussed the procedure including the risks, benefits and alternatives for the proposed anesthesia with the patient or authorized representative who has indicated his/her understanding and acceptance.     Plan Discussed with: CRNA and Surgeon  Anesthesia Plan Comments:         Anesthesia Quick Evaluation

## 2017-03-28 NOTE — ED Notes (Signed)
[facility] at bedside for transport. 

## 2017-03-28 NOTE — H&P (Signed)
[facility] Consult/Admission Note  Nathaniel Rivas [birth year]  [id number].    Requesting MD: Dr. Colvin Caroli Chief Complaint/Reason for Consult: appendicitis  HPI:   Patient is a 41 year old male otherwise healthy who presented to the [facility] emergency department with complaints of abdominal pain for 2 days. Pain is on the lower right side, constant, stabbing, nonradiating, 8/10, Goody powders helped slightly. No associated symptoms. Patient denies nausea, vomiting, fever, chills, chest pain, shortness breath, dysuria, hematuria, blood in the stools. Bowel movements have been normal. Patient does not take any anticoagulation therapy. Last meal was last evening at 6 PM. CT scan showed acute appendicitis without complicating feature.  ROS:  Review of Systems  Constitutional: Negative for chills, diaphoresis and fever.  Respiratory: Negative for shortness of breath.   Cardiovascular: Negative for chest pain.  Gastrointestinal: Positive for abdominal pain. Negative for blood in stool, constipation, diarrhea, nausea and vomiting.  Genitourinary: Negative for dysuria and frequency.  Neurological: Negative for loss of consciousness.  All other systems reviewed and are negative.    No family history on file.  Past Medical History:  Diagnosis Date  . Anxiety     Past Surgical History:  Procedure Laterality Date  . KNEE SURGERY      Social History:  reports that he has been smoking.  He has never used smokeless tobacco. He reports that he drinks alcohol. He reports that he does not use drugs.  Allergies: No Known Allergies  Medications Prior to Admission  Medication Sig Dispense Refill  . ALPRAZolam (XANAX) 0.25 MG tablet Take 0.25 mg by mouth 2 (two) times daily as needed for anxiety.       Blood pressure 130/84, pulse 60, temperature 97.9 F (36.6 C), temperature source Oral, resp. rate 18, height '5\' 9"'$  (1.753 m), weight 195 lb (88.5 kg), SpO2 100  %.  Physical Exam  Constitutional: He is oriented to person, place, and time and well-developed, well-nourished, and in no distress. No distress.  HENT:  Head: Normocephalic.  Nose: Nose normal.  Mouth/Throat: Oropharynx is clear and moist. No oropharyngeal exudate.  Eyes: Pupils are equal, round, and reactive to light. Conjunctivae are normal. Right eye exhibits no discharge. Left eye exhibits no discharge. No scleral icterus.  Neck: Normal range of motion. Neck supple. No tracheal deviation present. No thyromegaly present.  Cardiovascular: Normal rate, regular rhythm, normal heart sounds and intact distal pulses.  Exam reveals no gallop and no friction rub.   No murmur heard. Pulses:      Radial pulses are 2+ on the right side, and 2+ on the left side.       Posterior tibial pulses are 2+ on the right side, and 2+ on the left side.  Pulmonary/Chest: Effort normal and breath sounds normal. No respiratory distress. He has no wheezes. He has no rales. He exhibits no tenderness.  Abdominal: Soft. Normal appearance and bowel sounds are normal. He exhibits no distension. There is no hepatomegaly. There is tenderness in the right lower quadrant. There is tenderness at McBurney's point. No hernia.  Musculoskeletal: Normal range of motion. He exhibits no edema, tenderness or deformity.  Lymphadenopathy:    He has no cervical adenopathy.  Neurological: He is alert and oriented to person, place, and time. No cranial nerve deficit (Grossly intact).  Skin: Skin is warm and dry. No rash noted. He is not diaphoretic.  Psychiatric: Mood and affect normal.  Nursing note and vitals reviewed.   Results for orders placed or performed  during the hospital encounter of 03/28/17 (from the past 48 hour(s))  Comprehensive metabolic panel     Status: Abnormal   Collection Time: 03/28/17  9:28 AM  Result Value Ref Range   Sodium 137 135 - 145 mmol/L   Potassium 4.0 3.5 - 5.1 mmol/L   Chloride 102 101 - 111  mmol/L   CO2 27 22 - 32 mmol/L   Glucose, Bld 100 (H) 65 - 99 mg/dL   BUN 12 6 - 20 mg/dL   Creatinine, Ser 0.98 0.61 - 1.24 mg/dL   Calcium 9.0 8.9 - 10.3 mg/dL   Total Protein 7.2 6.5 - 8.1 g/dL   Albumin 3.9 3.5 - 5.0 g/dL   AST 21 15 - 41 U/L   ALT 22 17 - 63 U/L   Alkaline Phosphatase 61 38 - 126 U/L   Total Bilirubin 0.9 0.3 - 1.2 mg/dL   GFR calc non Af Amer >60 >60 mL/min   GFR calc Af Amer >60 >60 mL/min    Comment: (NOTE) The eGFR has been calculated using the CKD EPI equation. This calculation has not been validated in all clinical situations. eGFR's persistently <60 mL/min signify possible Chronic Kidney Disease.    Anion gap 8 5 - 15  CBC with Differential     Status: Abnormal   Collection Time: 03/28/17  9:28 AM  Result Value Ref Range   WBC 8.9 4.0 - 10.5 K/uL   RBC 4.17 (L) 4.22 - 5.81 MIL/uL   Hemoglobin 13.4 13.0 - 17.0 g/dL   HCT 38.6 (L) 39.0 - 52.0 %   MCV 92.6 78.0 - 100.0 fL   MCH 32.1 26.0 - 34.0 pg   MCHC 34.7 30.0 - 36.0 g/dL   RDW 12.5 11.5 - 15.5 %   Platelets 247 150 - 400 K/uL   Neutrophils Relative % 75 %   Neutro Abs 6.7 1.7 - 7.7 K/uL   Lymphocytes Relative 13 %   Lymphs Abs 1.2 0.7 - 4.0 K/uL   Monocytes Relative 9 %   Monocytes Absolute 0.8 0.1 - 1.0 K/uL   Eosinophils Relative 2 %   Eosinophils Absolute 0.2 0.0 - 0.7 K/uL   Basophils Relative 0 %   Basophils Absolute 0.0 0.0 - 0.1 K/uL  Lipase, blood     Status: None   Collection Time: 03/28/17  9:28 AM  Result Value Ref Range   Lipase 24 11 - 51 U/L  Urinalysis, Routine w reflex microscopic     Status: Abnormal   Collection Time: 03/28/17  9:56 AM  Result Value Ref Range   Color, Urine AMBER (A) YELLOW    Comment: BIOCHEMICALS MAY BE AFFECTED BY COLOR   APPearance CLEAR CLEAR   Specific Gravity, Urine 1.037 (H) 1.005 - 1.030   pH 6.5 5.0 - 8.0   Glucose, UA NEGATIVE NEGATIVE mg/dL   Hgb urine dipstick NEGATIVE NEGATIVE   Bilirubin Urine SMALL (A) NEGATIVE   Ketones, ur 15  (A) NEGATIVE mg/dL   Protein, ur NEGATIVE NEGATIVE mg/dL   Nitrite NEGATIVE NEGATIVE   Leukocytes, UA NEGATIVE NEGATIVE    Comment: Microscopic not done on urines with negative protein, blood, leukocytes, nitrite, or glucose < 500 mg/dL.   Ct Abdomen Pelvis W Contrast  Result Date: 03/28/2017 CLINICAL DATA:  Right lower quadrant pain since Friday. EXAM: CT ABDOMEN AND PELVIS WITH CONTRAST TECHNIQUE: Multidetector CT imaging of the abdomen and pelvis was performed using the standard protocol following bolus administration of intravenous contrast. CONTRAST:  144m ISOVUE-300 IOPAMIDOL (ISOVUE-300) INJECTION 61% COMPARISON:  None. FINDINGS: Lower chest: Lung bases show no acute findings. Heart size normal. No pericardial or pleural effusion. Hepatobiliary: Liver and gallbladder are unremarkable. No biliary ductal dilatation. Pancreas: Negative. Spleen: Negative. Adrenals/Urinary Tract: Adrenal glands and kidneys are unremarkable. Ureters are decompressed. Bladder is grossly unremarkable. Stomach/Bowel: Stomach and small bowel are unremarkable. Appendix is dilated with fluid and stranding adjacent to the distal portion. No evidence of rupture. No appendicolith. Colon is unremarkable. Vascular/Lymphatic: Atherosclerotic calcification of the arterial vasculature without aneurysm. No pathologically enlarged lymph nodes. Reproductive: Prostate is normal in size. Other: No free fluid. Mesenteries and peritoneum are otherwise unremarkable. Musculoskeletal: Negative. IMPRESSION: 1. Acute appendicitis without complicating feature. 2.  Aortic atherosclerosis (ICD10-170.0). Electronically Signed   By: MLorin PicketM.D.   On: 03/28/2017 11:59      Assessment/Plan Acute appendicitis - OR today with Dr. HExcell Seltzer- IV abx  JKalman Drape [facility] 03/28/2017, 3:03 PM Pager: [phone]: [phone]-Fri 7:00 am-4:30 pm Sat-Sun 7:00 am-11:30 am

## 2017-03-28 NOTE — Transfer of Care (Signed)
Immediate Anesthesia Transfer of Care Note  Patient: Nathaniel Rivas  Procedure(s) Performed: Procedure(s): APPENDECTOMY LAPAROSCOPIC (N/A)  Patient Location: PACU  Anesthesia Type:General  Level of Consciousness: awake, alert  and oriented  Airway & Oxygen Therapy: Patient Spontanous Breathing and Patient connected to face mask oxygen  Post-op Assessment: Report given to RN and Post -op Vital signs reviewed and stable  Post vital signs: Reviewed and stable  Last Vitals:  Vitals:   03/28/17 1500 03/28/17 1814  BP: 137/82 136/78  Pulse: 64 85  Resp: 16 16  Temp: 36.7 C 36.6 C  SpO2: 99% 97%    Last Pain:  Vitals:   03/28/17 1500  TempSrc: Oral  PainSc:       Patients Stated Pain Goal: 3 (03/28/17 1428)  Complications: No apparent anesthesia complications

## 2017-03-29 ENCOUNTER — Encounter (HOSPITAL_COMMUNITY): Payer: Self-pay | Admitting: General Surgery

## 2017-03-29 LAB — CBC
HCT: 35.5 % — ABNORMAL LOW (ref 39.0–52.0)
Hemoglobin: 12.5 g/dL — ABNORMAL LOW (ref 13.0–17.0)
MCH: 32.1 pg (ref 26.0–34.0)
MCHC: 35.2 g/dL (ref 30.0–36.0)
MCV: 91.3 fL (ref 78.0–100.0)
Platelets: 236 10*3/uL (ref 150–400)
RBC: 3.89 MIL/uL — AB (ref 4.22–5.81)
RDW: 12.3 % (ref 11.5–15.5)
WBC: 12.1 10*3/uL — ABNORMAL HIGH (ref 4.0–10.5)

## 2017-03-29 MED ORDER — HYDROCODONE-ACETAMINOPHEN 5-325 MG PO TABS: 1.0000 | ORAL_TABLET | ORAL | 0 refills | Status: AC | PRN

## 2017-03-29 MED ORDER — AMOXICILLIN-POT CLAVULANATE 875-125 MG PO TABS: 1.0000 | ORAL_TABLET | Freq: Two times a day (BID) | ORAL | 0 refills | Status: AC

## 2017-03-29 NOTE — Discharge Instructions (Signed)
Please arrive at least 30 min before your appointment to complete your check in paperwork.  If you are unable to arrive 30 min prior to your appointment time we may have to cancel or reschedule you. ° °LAPAROSCOPIC SURGERY: POST OP INSTRUCTIONS  °1. DIET: Follow a light bland diet the first 24 hours after arrival home, such as soup, liquids, crackers, etc. Be sure to include lots of fluids daily. Avoid fast food or heavy meals as your are more likely to get nauseated. Eat a low fat the next few days after surgery.  °2. Take your usually prescribed home medications unless otherwise directed. °3. PAIN CONTROL:  °1. Pain is best controlled by a usual combination of three different methods TOGETHER:  °1. Ice/Heat °2. Over the counter pain medication °3. Prescription pain medication °2. Most patients will experience some swelling and bruising around the incisions. Ice packs or heating pads (30-60 minutes up to 6 times a day) will help. Use ice for the first few days to help decrease swelling and bruising, then switch to heat to help relax tight/sore spots and speed recovery. Some people prefer to use ice alone, heat alone, alternating between ice & heat. Experiment to what works for you. Swelling and bruising can take several weeks to resolve.  °3. It is helpful to take an over-the-counter pain medication regularly for the first few weeks. Choose one of the following that works best for you:  °1. Naproxen (Aleve, etc) Two 220mg tabs twice a day °2. Ibuprofen (Advil, etc) Three 200mg tabs four times a day (every meal & bedtime) °3. Acetaminophen (Tylenol, etc) 500-650mg four times a day (every meal & bedtime) °4. A prescription for pain medication (such as oxycodone, hydrocodone, etc) should be given to you upon discharge. Take your pain medication as prescribed.  °1. If you are having problems/concerns with the prescription medicine (does not control pain, nausea, vomiting, rash, itching, etc), please call us [phone]  [phone] to see if we need to switch you to a different pain medicine that will work better for you and/or control your side effect better. °2. If you need a refill on your pain medication, please contact your pharmacy. They will contact our office to request authorization. Prescriptions will not be filled after 5 pm or on week-ends. °4. Avoid getting constipated. Between the surgery and the pain medications, it is common to experience some constipation. Increasing fluid intake and taking a fiber supplement (such as Metamucil, Citrucel, FiberCon, MiraLax, etc) 1-2 times a day regularly will usually help prevent this problem from occurring. A mild laxative (prune juice, Milk of Magnesia, MiraLax, etc) should be taken according to package directions if there are no bowel movements after 48 hours.  °5. Watch out for diarrhea. If you have many loose bowel movements, simplify your diet to bland foods & liquids for a few days. Stop any stool softeners and decrease your fiber supplement. Switching to mild anti-diarrheal medications (Kayopectate, Pepto Bismol) can help. If this worsens or does not improve, please call us. °6. Wash / shower every day. You may shower over the glue as it is waterproof.  °7. ACTIVITIES as tolerated:  °1. You may resume regular (light) daily activities beginning the next day--such as daily self-care, walking, climbing stairs--gradually increasing activities as tolerated. If you can walk 30 minutes without difficulty, it is safe to try more intense activity such as jogging, treadmill, bicycling, low-impact aerobics, swimming, etc. °2. Save the most intensive and strenuous activity for last such as   sit-ups, heavy lifting, contact sports, etc Refrain from any heavy lifting or straining until you are off narcotics for pain control.  °3. DO NOT PUSH THROUGH PAIN. Let pain be your guide: If it hurts to do something, don't do it. Pain is your body warning you to avoid that activity for another week  until the pain goes down. °4. You may drive when you are no longer taking prescription pain medication, you can comfortably wear a seatbelt, and you can safely maneuver your car and apply brakes. °5. You may have sexual intercourse when it is comfortable.  °8. FOLLOW UP in our office  °1. Please call [facility] at [phone] to set up an appointment to see your surgeon in the office for a follow-up appointment approximately 2-3 weeks after your surgery. °2. Make sure that you call for this appointment the day you arrive home to insure a convenient appointment time. °     10. IF YOU HAVE DISABILITY OR FAMILY LEAVE FORMS, BRING THEM TO THE               OFFICE FOR PROCESSING.  ° °WHEN TO CALL US [phone]:  °1. Poor pain control °2. Reactions / problems with new medications (rash/itching, nausea, etc)  °3. Fever over 101.5 F (38.5 C) °4. Inability to urinate °5. Nausea and/or vomiting °6. Worsening swelling or bruising °7. Continued bleeding from incision. °8. Increased pain, redness, or drainage from the incision ° °The clinic staff is available to answer your questions during regular business hours (8:30am-5pm). Please don’t hesitate to call and ask to speak to one of our nurses for clinical concerns.  °If you have a medical emergency, go to the nearest emergency room or call 911.  °A surgeon from [facility] is always on call at the hospitals  ° °[facility]  °[address], [address], [place], [place] [address] ?  °MAIN: [phone] ? TOLL FREE: [phone] ?  °FAX [phone]  °[email or web address] °

## 2017-03-29 NOTE — Discharge Summary (Signed)
[facility] Discharge Summary   Patient ID: Nathaniel Rivas MRN: [id number] DOB/AGE: 39/11/1975 39 y.o.  Admit date: 03/28/2017 Discharge date: 03/29/2017  Admitting Diagnosis: appendicitis  Discharge Diagnosis Patient Active Problem List   Diagnosis Date Noted  . Appendicitis 03/28/2017  . Acute appendicitis with perforation and peritoneal abscess 03/28/2017  . Right knee pain 04/22/2016  . Tobacco use 10/06/2015  . Elevated cholesterol 09/27/2014  . Anxiety 04/25/2014    Consultants none  Imaging: Ct Abdomen Pelvis W Contrast  Result Date: 03/28/2017 CLINICAL DATA:  Right lower quadrant pain since Friday. EXAM: CT ABDOMEN AND PELVIS WITH CONTRAST TECHNIQUE: Multidetector CT imaging of the abdomen and pelvis was performed using the standard protocol following bolus administration of intravenous contrast. CONTRAST:  100mL ISOVUE-300 IOPAMIDOL (ISOVUE-300) INJECTION 61% COMPARISON:  None. FINDINGS: Lower chest: Lung bases show no acute findings. Heart size normal. No pericardial or pleural effusion. Hepatobiliary: Liver and gallbladder are unremarkable. No biliary ductal dilatation. Pancreas: Negative. Spleen: Negative. Adrenals/Urinary Tract: Adrenal glands and kidneys are unremarkable. Ureters are decompressed. Bladder is grossly unremarkable. Stomach/Bowel: Stomach and small bowel are unremarkable. Appendix is dilated with fluid and stranding adjacent to the distal portion. No evidence of rupture. No appendicolith. Colon is unremarkable. Vascular/Lymphatic: Atherosclerotic calcification of the arterial vasculature without aneurysm. No pathologically enlarged lymph nodes. Reproductive: Prostate is normal in size. Other: No free fluid. Mesenteries and peritoneum are otherwise unremarkable. Musculoskeletal: Negative. IMPRESSION: 1. Acute appendicitis without complicating feature. 2.  Aortic atherosclerosis (ICD10-170.0). Electronically Signed   By: Leanna BattlesMelinda  Blietz M.D.    On: 03/28/2017 11:59    Procedures Dr. Johna SheriffHoxworth (03/28/17) - Laparoscopic Appendectomy  Hospital Course:  Nathaniel Rivas [facility].  Workup showed appendicitis.  Patient was admitted and underwent procedure listed above.  Tolerated procedure well and was transferred to the floor.  Diet was advanced as tolerated.  On POD#1, the patient was voiding well, had a BM, tolerating diet, ambulating well, pain well controlled, vital signs stable, incisions c/d/i and felt stable for discharge home.  Patient will follow up in our office in 2 weeks and knows to call with questions or concerns.  He will call to confirm appointment date/time.    Patient was discharged in good condition.  The [place] Substance controlled database was reviewed prior to prescribing narcotic pain medication to this patient.  Physical Exam: General:  Alert, NAD, pleasant, cooperative, well appearing Cardio: RRR, S1 & S2 normal, no murmur, rubs, gallops Resp: Effort normal, lungs CTA bilaterally anteriorly, no wheezes, rales, rhonchi Abd:  Soft, ND, normal bowel sounds, no tenderness, incisions with glue intact and no surrounding erythema or drainage noted  Skin: no rashes noted, warm and dry  Allergies as of 03/29/2017   No Known Allergies     Medication List    TAKE these medications   ALPRAZolam 0.25 MG tablet Commonly known as:  XANAX Take 0.25 mg by mouth 2 (two) times daily as needed for anxiety.   amoxicillin-clavulanate 875-125 MG tablet Commonly known as:  AUGMENTIN Take 1 tablet by mouth every 12 (twelve) hours.   HYDROcodone-acetaminophen 5-325 MG tablet Commonly known as:  NORCO/VICODIN Take 1 tablet by mouth every 4 (four) hours as needed for moderate pain.        Follow-up Information    [facility] [place] Call.   Specialty:  General Surgery Why:  to confirm appointment date and time of follow up appointment that we are making for you Contact  information: [address] [place] [address] [phone]          Signed: Joyce Copa [facility] 03/29/2017, 10:41 AM Pager: [phone] Consults: [phone] Mon-Fri 7:00 am-4:30 pm Sat-Sun 7:00 am-11:30 am

## 2017-03-29 NOTE — Progress Notes (Signed)
Pt tolerating diet and ambulating in hallway.  D/C instructions and prescriptions were given.  Understanding was verbalized. Was D/Cd home.

## 2019-09-23 ENCOUNTER — Encounter (HOSPITAL_BASED_OUTPATIENT_CLINIC_OR_DEPARTMENT_OTHER): Payer: Self-pay | Admitting: Emergency Medicine

## 2019-09-23 ENCOUNTER — Other Ambulatory Visit: Payer: Self-pay

## 2019-09-23 ENCOUNTER — Emergency Department (HOSPITAL_BASED_OUTPATIENT_CLINIC_OR_DEPARTMENT_OTHER)
Admission: EM | Admit: 2019-09-23 | Discharge: 2019-09-23 | Disposition: A | Discharge status: Home / Self Care | Payer: BC Managed Care – PPO | Attending: Emergency Medicine | Admitting: Emergency Medicine

## 2019-09-23 DIAGNOSIS — M79602 Pain in left arm: Secondary | ICD-10-CM

## 2019-09-23 DIAGNOSIS — F17210 Nicotine dependence, cigarettes, uncomplicated: Secondary | ICD-10-CM | POA: Diagnosis not present

## 2019-09-23 MED ORDER — OXYCODONE-ACETAMINOPHEN 5-325 MG PO TABS: 1.0000 | ORAL_TABLET | Freq: Four times a day (QID) | ORAL | 0 refills | Status: AC | PRN

## 2019-09-23 MED ORDER — OXYCODONE-ACETAMINOPHEN 5-325 MG PO TABS
1.0000 | ORAL_TABLET | Freq: Once | ORAL | Status: AC
  Administered 2019-09-23: 13:00:00 1 via ORAL
  Filled 2019-09-23: qty 1

## 2019-09-23 NOTE — Discharge Instructions (Signed)
Please read instructions below. You can take oxycodone every 6 hours as needed for severe pain.  Be aware this medication can make you drowsy, especially with your prescribed medications.  Do not drive or drink alcohol while taking it. Take 600 mg of ibuprofen every 6 hours to help with pain inflammation. You can apply ice or heat if this provides relief. Call the orthopedic specialist office the next business day to schedule an appointment for follow up on your visit today. Return to the ED for concerning symptoms.

## 2019-09-23 NOTE — ED Triage Notes (Signed)
L shoulder pain radiating down to elbow and hand x 2 weeks. Seen by ortho and given steroids with some relief. Pain increased yesterday. No known injury.

## 2019-09-23 NOTE — ED Provider Notes (Signed)
[facility] EMERGENCY DEPARTMENT Provider Note   CSN: [id number] Arrival date & time: 09/23/19  1158     History Chief Complaint  Patient presents with  . Arm Pain    Nathaniel Rivas is a 44 y.o. male w PMHx anxiety, presenting to the emergency department with persistent pain to the left arm.  Patient was evaluated on 09/17/2019 by orthopedics at [facility] for this.  Symptoms began a couple of weeks ago, without particular injury though patient does do some heavy lifting for work.  Per chart review, he was suspected to have a cervical radiculopathy the left.  He is treated with gabapentin, prednisone, and provided referral to spine specialist.  He states they called him on Friday and his symptoms were much improved therefore he stated he did not need the follow-up appointment, however noticed yesterday his symptoms began worsening again.  He took his last prednisone this morning.  Pain is worse near the elbow.  It is not aggravated by any particular movement or palpation.  It is sharp in nature.  Denies numbness or weakness in his arm.  He has taken a Goody's powder for his symptoms in addition to the gabapentin without enough relief.  The history is provided by the patient and medical records.       Past Medical History:  Diagnosis Date  . Anxiety     Patient Active Problem List   Diagnosis Date Noted  . Appendicitis 03/28/2017  . Acute appendicitis with perforation and peritoneal abscess 03/28/2017  . Right knee pain 04/22/2016  . Tobacco use 10/06/2015  . Elevated cholesterol 09/27/2014  . Anxiety 04/25/2014    Past Surgical History:  Procedure Laterality Date  . KNEE SURGERY    . LAPAROSCOPIC APPENDECTOMY N/A 03/28/2017   Procedure: APPENDECTOMY LAPAROSCOPIC;  Surgeon: Excell Seltzer, MD;  Location: [facility];  Service: General;  Laterality: N/A;       No family history on file.  Social History   Tobacco Use  . Smoking status: Current Some Day Smoker  .  Smokeless tobacco: Never Used  Substance Use Topics  . Alcohol use: Yes  . Drug use: No    Home Medications Prior to Admission medications   Medication Sig Start Date End Date Taking? Authorizing Provider  ALPRAZolam (XANAX) 0.25 MG tablet Take 0.25 mg by mouth 2 (two) times daily as needed for anxiety.  04/06/16   [provider]  HYDROcodone-acetaminophen (NORCO/VICODIN) 5-325 MG tablet Take 1 tablet by mouth every 4 (four) hours as needed for moderate pain. 03/29/17   Focht, Fraser Din, PA  oxyCODONE-acetaminophen (PERCOCET/ROXICET) 5-325 MG tablet Take 1 tablet by mouth every 6 (six) hours as needed for severe pain. 09/23/19   Shauntee Karp, [place] N, PA-C    Allergies    Patient has no known allergies.  Review of Systems   Review of Systems  All other systems reviewed and are negative.   Physical Exam Updated Vital Signs BP (!) 137/99 (BP Location: Right Arm)   Pulse (!) 117   Temp 98.9 F (37.2 C) (Oral)   Resp 20   Ht 5\' 9"  (1.753 m)   Wt 84.8 kg   SpO2 100%   BMI 27.62 kg/m   Physical Exam Vitals and nursing note reviewed.  Constitutional:      Appearance: He is well-developed.  HENT:     Head: Normocephalic and atraumatic.  Eyes:     Conjunctiva/sclera: Conjunctivae normal.  Cardiovascular:     Rate and Rhythm: Normal rate  and regular rhythm.  Pulmonary:     Effort: Pulmonary effort is normal. No respiratory distress.     Breath sounds: Normal breath sounds.  Musculoskeletal:     Cervical back: Normal range of motion and neck supple. No tenderness.     Comments: No tenderness along the midline C-spine or paraspinal musculature.  There is no tenderness along the trapezius muscle group or to the left upper extremity.  No skin changes.  Full normal range of motion of all joints.  5/5 grip strength bilateral upper extremities, strong radial pulses.  Normal sensation.  Neurological:     Mental Status: He is alert.  Psychiatric:        Mood and Affect: Mood  normal.        Behavior: Behavior normal.     ED Results / Procedures / Treatments   Labs (all labs ordered are listed, but only abnormal results are displayed) Labs Reviewed - No data to display  EKG None  Radiology No results found.  Procedures Procedures (including critical care time)  Medications Ordered in ED Medications  oxyCODONE-acetaminophen (PERCOCET/ROXICET) 5-325 MG per tablet 1 tablet (1 tablet Oral Given 09/23/19 1325)    ED Course  I have reviewed the triage vital signs and the nursing notes.  Pertinent labs & imaging results that were available during my care of the patient were reviewed by me and considered in my medical decision making (see chart for details).    MDM Rules/Calculators/A&P                     Patient with recent diagnosis of cervical radiculopathy, presenting with worsening pain.  No new numbness or weakness.  No new injuries.  Exam is unremarkable.  No red flags or concerning findings to indicate the need for emergent advanced imaging today.  Patient currently taking gabapentin and finished prednisone course.  Recommend NSAIDs.  We will also provide small amount of oxycodone for breakthrough pain.  He is encouraged to follow-up closely with orthopedics and make that appointment with the spine specialist for further evaluation.  Discussed strict return precautions.  Discussed results, findings, treatment and follow up. Patient advised of return precautions. Patient verbalized understanding and agreed with plan.  [place] Controlled Substance reporting System queried  Final Clinical Impression(s) / ED Diagnoses Final diagnoses:  Left arm pain    Rx / DC Orders ED Discharge Orders         Ordered    oxyCODONE-acetaminophen (PERCOCET/ROXICET) 5-325 MG tablet  Every 6 hours PRN     09/23/19 1304           Emmett Arntz, [place] N, [place] 09/23/19 1439    Jacalyn Lefevre, MD 09/23/19 1534

## 2020-03-21 ENCOUNTER — Encounter (HOSPITAL_BASED_OUTPATIENT_CLINIC_OR_DEPARTMENT_OTHER): Payer: Self-pay | Admitting: *Deleted

## 2020-03-21 ENCOUNTER — Other Ambulatory Visit: Payer: Self-pay

## 2020-03-21 ENCOUNTER — Emergency Department (HOSPITAL_BASED_OUTPATIENT_CLINIC_OR_DEPARTMENT_OTHER): Payer: BC Managed Care – PPO

## 2020-03-21 ENCOUNTER — Emergency Department (HOSPITAL_BASED_OUTPATIENT_CLINIC_OR_DEPARTMENT_OTHER)
Admission: EM | Admit: 2020-03-21 | Discharge: 2020-03-21 | Disposition: A | Discharge status: Home / Self Care | Payer: BC Managed Care – PPO | Attending: Emergency Medicine | Admitting: Emergency Medicine

## 2020-03-21 DIAGNOSIS — F17200 Nicotine dependence, unspecified, uncomplicated: Secondary | ICD-10-CM | POA: Insufficient documentation

## 2020-03-21 DIAGNOSIS — R072 Precordial pain: Secondary | ICD-10-CM | POA: Diagnosis not present

## 2020-03-21 DIAGNOSIS — R0789 Other chest pain: Secondary | ICD-10-CM | POA: Insufficient documentation

## 2020-03-21 NOTE — ED Triage Notes (Signed)
Pt reports a "sharp, stabbing" in his left chest last week, increased by taking deep breath. Resolved, then noticed again this am for approx 45 seconds according to pt, denies any sob, nausea, dizzyness or other c/o, pain is currently not present, has not returned since this 45 second episode this am. ekg performed at triage, shown to edp by fernando, emt.

## 2020-03-21 NOTE — ED Provider Notes (Signed)
[facility] EMERGENCY DEPARTMENT Provider Note   CSN: [id number] Arrival date & time: 03/21/20  1914     History Chief Complaint  Patient presents with  . Chest Pain    Nathaniel Rivas is a 44 y.o. male.  Patient c/o sharp chest pain. Indicates shortly after awakening today, got a sharp, localized pain to midline/lower sternum, lasting approximately 45 seconds. Pain acute onset, non radiating, sharp, localized, at rest. States had a similar 30-45 second sharp, transient pain at rest a week ago as well. Currently, no cp or discomfort. No other recent cp or discomfort. No exertional cp. No associated nv, diaphoresis or sob. No heartburn. No hx cad, or fam hx premature cad. No leg pain or swelling. No hx dvt or pe. No constant and/or pleuritic chest pain. Denies cough or uri symptoms. No fevers. No chest wall injury.   The history is provided by the patient.  Chest Pain Associated symptoms: no abdominal pain, no back pain, no cough, no fever, no headache, no nausea, no palpitations, no shortness of breath and no vomiting        Past Medical History:  Diagnosis Date  . Anxiety     Patient Active Problem List   Diagnosis Date Noted  . Appendicitis 03/28/2017  . Acute appendicitis with perforation and peritoneal abscess 03/28/2017  . Right knee pain 04/22/2016  . Tobacco use 10/06/2015  . Elevated cholesterol 09/27/2014  . Anxiety 04/25/2014    Past Surgical History:  Procedure Laterality Date  . KNEE SURGERY    . LAPAROSCOPIC APPENDECTOMY N/A 03/28/2017   Procedure: APPENDECTOMY LAPAROSCOPIC;  Surgeon: Glenna Fellows, MD;  Location: [facility];  Service: General;  Laterality: N/A;       History reviewed. No pertinent family history.  Social History   Tobacco Use  . Smoking status: Current Some Day Smoker  . Smokeless tobacco: Never Used  Substance Use Topics  . Alcohol use: Yes  . Drug use: No    Home Medications Prior to Admission medications     Medication Sig Start Date End Date Taking? Authorizing Provider  ALPRAZolam (XANAX) 0.25 MG tablet Take 0.25 mg by mouth 2 (two) times daily as needed for anxiety.  04/06/16   [provider]  HYDROcodone-acetaminophen (NORCO/VICODIN) 5-325 MG tablet Take 1 tablet by mouth every 4 (four) hours as needed for moderate pain. 03/29/17   Focht, Joyce Copa, PA  oxyCODONE-acetaminophen (PERCOCET/ROXICET) 5-325 MG tablet Take 1 tablet by mouth every 6 (six) hours as needed for severe pain. 09/23/19   Robinson, [place] N, PA-C    Allergies    Patient has no known allergies.  Review of Systems   Review of Systems  Constitutional: Negative for fever.  HENT: Negative for sore throat.   Eyes: Negative for redness.  Respiratory: Negative for cough and shortness of breath.   Cardiovascular: Positive for chest pain. Negative for palpitations and leg swelling.  Gastrointestinal: Negative for abdominal pain, nausea and vomiting.  Genitourinary: Negative for flank pain.  Musculoskeletal: Negative for back pain and neck pain.  Skin: Negative for rash.  Neurological: Negative for headaches.  Hematological: Does not bruise/bleed easily.  Psychiatric/Behavioral: Negative for confusion.    Physical Exam Updated Vital Signs BP (!) 119/92   Pulse 65   Temp 98.4 F (36.9 C) (Oral)   Ht 1.753 m (5\' 9" )   Wt 89.4 kg   SpO2 100%   BMI 29.09 kg/m   Physical Exam Vitals and nursing note reviewed.  Constitutional:  Appearance: Normal appearance. He is well-developed.  HENT:     Head: Atraumatic.     Nose: Nose normal.     Mouth/Throat:     Mouth: Mucous membranes are moist.  Eyes:     General: No scleral icterus.    Conjunctiva/sclera: Conjunctivae normal.  Neck:     Trachea: No tracheal deviation.  Cardiovascular:     Rate and Rhythm: Normal rate and regular rhythm.     Pulses: Normal pulses.     Heart sounds: Normal heart sounds. No murmur heard.  No friction rub. No gallop.    Pulmonary:     Effort: Pulmonary effort is normal. No accessory muscle usage or respiratory distress.     Breath sounds: Normal breath sounds.  Chest:     Chest wall: No tenderness.  Abdominal:     General: Bowel sounds are normal. There is no distension.     Palpations: Abdomen is soft.     Tenderness: There is no abdominal tenderness.  Genitourinary:    Comments: No cva tenderness. Musculoskeletal:        General: No swelling or tenderness.     Cervical back: Neck supple. No rigidity.     Right lower leg: No edema.     Left lower leg: No edema.  Skin:    General: Skin is warm and dry.     Findings: No rash.  Neurological:     Mental Status: He is alert.     Comments: Alert, speech clear.   Psychiatric:        Mood and Affect: Mood normal.     ED Results / Procedures / Treatments   Labs (all labs ordered are listed, but only abnormal results are displayed) Labs Reviewed - No data to display  EKG EKG Interpretation  Date/Time:  Friday March 21 2020 08:28:07 EDT Ventricular Rate:  63 PR Interval:  152 QRS Duration: 92 QT Interval:  378 QTC Calculation: 386 R Axis:   57 Text Interpretation: Normal sinus rhythm Normal ECG No previous tracing Confirmed by Cathren Laine ([id number]) on 03/21/2020 8:48:12 AM   Radiology DG Chest 2 View  Result Date: 03/21/2020 CLINICAL DATA:  Pain. Additional provided: Patient reports stabbing midsternal chest pain lasting 45 seconds to 1 minute, increased pain with inspiration. EXAM: CHEST - 2 VIEW COMPARISON:  CT abdomen/pelvis 03/28/2017 FINDINGS: Heart size within normal limits. There is no appreciable airspace consolidation. No evidence of pleural effusion or pneumothorax. No acute bony abnormality identified. IMPRESSION: No evidence of active cardiopulmonary disease. Electronically Signed   By: Jackey Loge DO   On: 03/21/2020 09:11    Procedures Procedures (including critical care time)  Medications Ordered in ED Medications - No  data to display  ED Course  I have reviewed the triage vital signs and the nursing notes.  Pertinent labs & imaging results that were available during my care of the patient were reviewed by me and considered in my medical decision making (see chart for details).    MDM Rules/Calculators/A&P                          Reviewed nursing notes and prior charts for additional history.   Patient with sharp, transient, localized pain at rest, and is symptom free. Earlier symptoms do not appear c/w ACS, and seem most c/w a musculoskeletal type of pain.   CXR reviewed/interpreted by me - no pna or ptx.   Pt appears stable for  d/c.   Rec pcp f/u.   Return precautions provided.    Final Clinical Impression(s) / ED Diagnoses Final diagnoses:  None    Rx / DC Orders ED Discharge Orders    None       Cathren Laine, MD 03/21/20 [phone]

## 2020-03-21 NOTE — ED Notes (Signed)
Pt on monitor 

## 2020-03-21 NOTE — Discharge Instructions (Addendum)
It was our pleasure to provide your ER care today - we hope that you feel better.  You exam, vital signs, and chest xray look good/normal.  You may try ibuprofen or naprosyn as need.   Follow up with primary care doctor in the next 1-2 weeks.   Return to ER if worse, new symptoms, recurrent/persistent chest pain, trouble breathing, exertional chest pain, fevers, or other concern.

## 2020-03-21 NOTE — ED Notes (Signed)
ED Provider at bedside. 

## 2021-07-08 IMAGING — CR DG CHEST 2V
2 series · 2 of 2 positions shown · non-contrast
Comparison: CT abdomen/pelvis 03/28/2017

CLINICAL DATA: Pain. Additional provided: Patient reports stabbing
midsternal chest pain lasting 45 seconds to 1 minute, increased pain
with inspiration.

EXAM:
CHEST - 2 VIEW

[w chest pa]
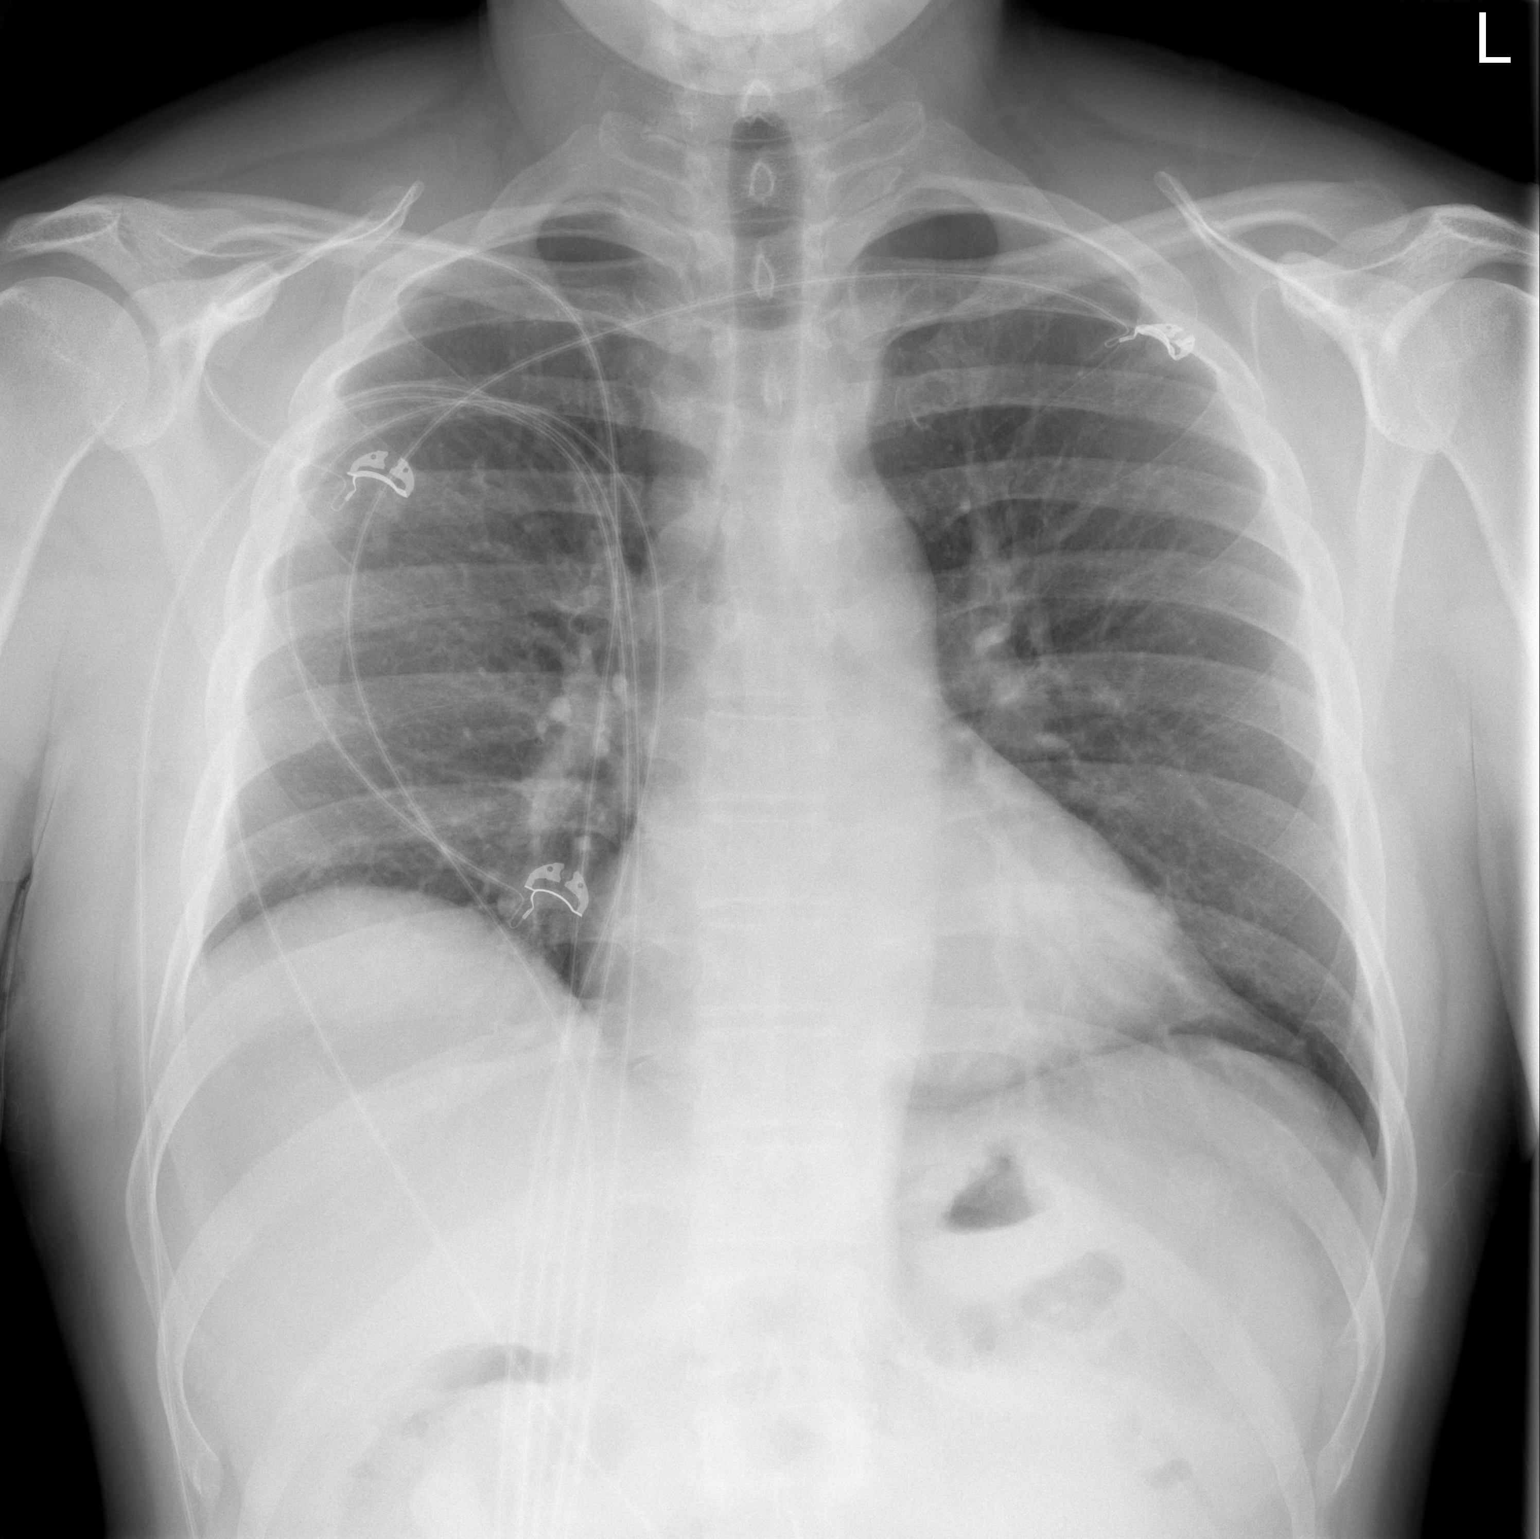

[w chest lat]
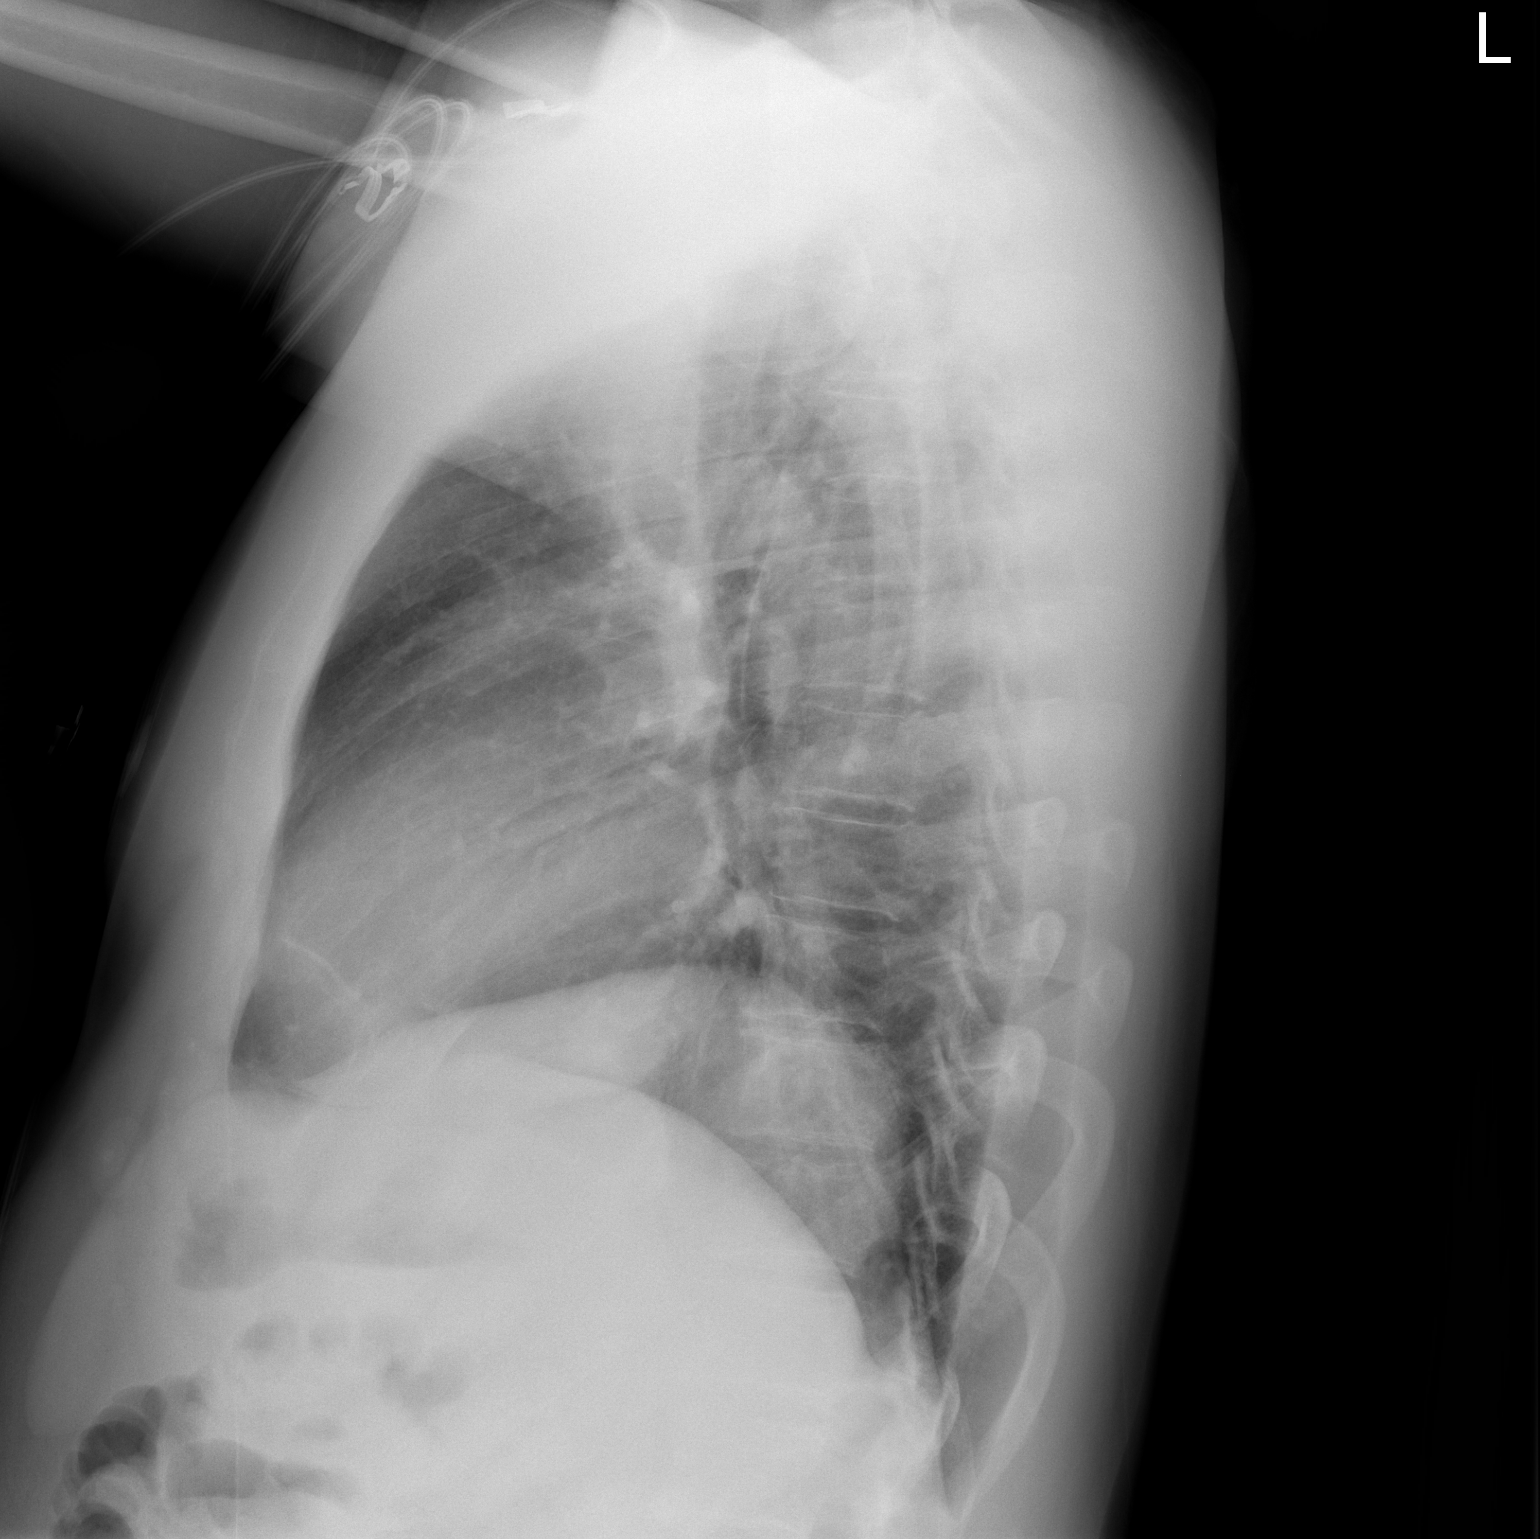

[2 of 2 positions shown; findings below may reference images not displayed]

FINDINGS: Heart size within normal limits.

There is no appreciable airspace consolidation.

No evidence of pleural effusion or pneumothorax.

No acute bony abnormality identified.
IMPRESSION: No evidence of active cardiopulmonary disease.
